# Patient Record
Sex: Female | Born: 1945
Health system: Southern US, Community
[De-identification: ages and names within clinical notes are randomized; demographics above are authoritative.]

## PROBLEM LIST (undated history)

## (undated) DIAGNOSIS — N6099 Unspecified benign mammary dysplasia of unspecified breast: Secondary | ICD-10-CM

## (undated) DIAGNOSIS — H409 Unspecified glaucoma: Secondary | ICD-10-CM

## (undated) HISTORY — DX: Unspecified benign mammary dysplasia of unspecified breast: N60.99

## (undated) HISTORY — PX: COLONOSCOPY: SHX174

## (undated) HISTORY — DX: Unspecified glaucoma: H40.9

## (undated) HISTORY — PX: WISDOM TOOTH EXTRACTION: SHX21

---

## 1999-09-22 ENCOUNTER — Other Ambulatory Visit: Admission: RE | Admit: 1999-09-22 | Discharge: 1999-09-22 | Payer: Self-pay | Admitting: Internal Medicine

## 2002-01-02 ENCOUNTER — Other Ambulatory Visit: Admission: RE | Admit: 2002-01-02 | Discharge: 2002-01-02 | Payer: Self-pay | Admitting: Internal Medicine

## 2004-04-26 ENCOUNTER — Ambulatory Visit: Payer: Self-pay | Admitting: Internal Medicine

## 2004-05-18 ENCOUNTER — Ambulatory Visit: Payer: Self-pay | Admitting: Internal Medicine

## 2004-05-18 ENCOUNTER — Encounter: Admission: RE | Admit: 2004-05-18 | Discharge: 2004-05-18 | Payer: Self-pay | Admitting: Internal Medicine

## 2004-05-28 ENCOUNTER — Ambulatory Visit: Payer: Self-pay | Admitting: Internal Medicine

## 2004-06-01 ENCOUNTER — Ambulatory Visit (HOSPITAL_COMMUNITY): Admission: RE | Admit: 2004-06-01 | Discharge: 2004-06-01 | Payer: Self-pay | Admitting: Internal Medicine

## 2006-08-10 ENCOUNTER — Ambulatory Visit: Payer: Self-pay | Admitting: Internal Medicine

## 2006-08-10 LAB — CONVERTED CEMR LAB
ALT: 22 units/L (ref 0–40)
AST: 21 units/L (ref 0–37)
Albumin: 4.1 g/dL (ref 3.5–5.2)
Alkaline Phosphatase: 75 units/L (ref 39–117)
BUN: 12 mg/dL (ref 6–23)
Basophils Absolute: 0 10*3/uL (ref 0.0–0.1)
Basophils Relative: 0.7 % (ref 0.0–1.0)
Bilirubin Urine: NEGATIVE
Bilirubin, Direct: 0.1 mg/dL (ref 0.0–0.3)
CO2: 32 meq/L (ref 19–32)
Calcium: 9.7 mg/dL (ref 8.4–10.5)
Chloride: 102 meq/L (ref 96–112)
Cholesterol: 186 mg/dL (ref 0–200)
Creatinine, Ser: 1 mg/dL (ref 0.4–1.2)
Eosinophils Absolute: 0.1 10*3/uL (ref 0.0–0.6)
Eosinophils Relative: 2.4 % (ref 0.0–5.0)
GFR calc Af Amer: 73 mL/min
GFR calc non Af Amer: 61 mL/min
Glucose, Bld: 93 mg/dL (ref 70–99)
HCT: 41.4 % (ref 36.0–46.0)
HDL: 114 mg/dL (ref >39.0)
Hemoglobin, Urine: NEGATIVE
Hemoglobin: 14.3 g/dL (ref 12.0–15.0)
Ketones, ur: NEGATIVE mg/dL
LDL Cholesterol: 54 mg/dL (ref 0–99)
Leukocytes, UA: NEGATIVE
Lymphocytes Relative: 30.2 % (ref 12.0–46.0)
MCHC: 34.7 g/dL (ref 30.0–36.0)
MCV: 87.1 fL (ref 78.0–100.0)
Monocytes Absolute: 0.6 10*3/uL (ref 0.2–0.7)
Monocytes Relative: 10.7 % (ref 3.0–11.0)
Neutro Abs: 3.5 10*3/uL (ref 1.4–7.7)
Neutrophils Relative %: 56 % (ref 43.0–77.0)
Nitrite: NEGATIVE
Platelets: 269 10*3/uL (ref 150–400)
Potassium: 3.8 meq/L (ref 3.5–5.1)
RBC: 4.75 M/uL (ref 3.87–5.11)
RDW: 13 % (ref 11.5–14.6)
Sodium: 141 meq/L (ref 135–145)
Specific Gravity, Urine: 1.01 (ref 1.000–1.03)
TSH: 2.37 microintl units/mL (ref 0.35–5.50)
Total Bilirubin: 0.8 mg/dL (ref 0.3–1.2)
Total CHOL/HDL Ratio: 1.6
Total Protein, Urine: NEGATIVE mg/dL
Total Protein: 7.3 g/dL (ref 6.0–8.3)
Triglycerides: 91 mg/dL (ref 0–149)
Urine Glucose: NEGATIVE mg/dL
Urobilinogen, UA: 0.2 (ref 0.0–1.0)
VLDL: 18 mg/dL (ref 0–40)
WBC: 6 10*3/uL (ref 4.5–10.5)
pH: 6 (ref 5.0–8.0)

## 2007-08-06 ENCOUNTER — Telehealth (INDEPENDENT_AMBULATORY_CARE_PROVIDER_SITE_OTHER): Payer: Self-pay | Admitting: *Deleted

## 2007-08-10 ENCOUNTER — Ambulatory Visit: Payer: Self-pay | Admitting: Gastroenterology

## 2007-09-07 ENCOUNTER — Encounter: Payer: Self-pay | Admitting: Gastroenterology

## 2007-09-07 ENCOUNTER — Ambulatory Visit: Payer: Self-pay | Admitting: Gastroenterology

## 2007-09-07 ENCOUNTER — Encounter: Payer: Self-pay | Admitting: Internal Medicine

## 2007-09-15 ENCOUNTER — Encounter: Payer: Self-pay | Admitting: Internal Medicine

## 2007-09-17 ENCOUNTER — Encounter: Payer: Self-pay | Admitting: Gastroenterology

## 2012-08-30 ENCOUNTER — Encounter: Payer: Self-pay | Admitting: Gastroenterology

## 2013-09-24 DIAGNOSIS — Z961 Presence of intraocular lens: Secondary | ICD-10-CM | POA: Diagnosis not present

## 2013-09-24 DIAGNOSIS — H524 Presbyopia: Secondary | ICD-10-CM | POA: Diagnosis not present

## 2013-09-24 DIAGNOSIS — H52209 Unspecified astigmatism, unspecified eye: Secondary | ICD-10-CM | POA: Diagnosis not present

## 2013-12-28 DIAGNOSIS — Z803 Family history of malignant neoplasm of breast: Secondary | ICD-10-CM | POA: Diagnosis not present

## 2013-12-28 DIAGNOSIS — Z1231 Encounter for screening mammogram for malignant neoplasm of breast: Secondary | ICD-10-CM | POA: Diagnosis not present

## 2014-01-09 ENCOUNTER — Encounter: Payer: Self-pay | Admitting: Gastroenterology

## 2014-04-10 DIAGNOSIS — Z23 Encounter for immunization: Secondary | ICD-10-CM | POA: Diagnosis not present

## 2014-07-15 ENCOUNTER — Encounter: Payer: Self-pay | Admitting: Gastroenterology

## 2014-10-31 DIAGNOSIS — H52203 Unspecified astigmatism, bilateral: Secondary | ICD-10-CM | POA: Diagnosis not present

## 2014-10-31 DIAGNOSIS — Z961 Presence of intraocular lens: Secondary | ICD-10-CM | POA: Diagnosis not present

## 2014-10-31 DIAGNOSIS — H40013 Open angle with borderline findings, low risk, bilateral: Secondary | ICD-10-CM | POA: Diagnosis not present

## 2015-01-01 DIAGNOSIS — L209 Atopic dermatitis, unspecified: Secondary | ICD-10-CM | POA: Diagnosis not present

## 2015-01-02 DIAGNOSIS — Z1231 Encounter for screening mammogram for malignant neoplasm of breast: Secondary | ICD-10-CM | POA: Diagnosis not present

## 2015-01-02 DIAGNOSIS — Z803 Family history of malignant neoplasm of breast: Secondary | ICD-10-CM | POA: Diagnosis not present

## 2015-01-09 DIAGNOSIS — R928 Other abnormal and inconclusive findings on diagnostic imaging of breast: Secondary | ICD-10-CM | POA: Diagnosis not present

## 2015-01-09 DIAGNOSIS — R922 Inconclusive mammogram: Secondary | ICD-10-CM | POA: Diagnosis not present

## 2015-01-13 ENCOUNTER — Other Ambulatory Visit: Payer: Self-pay | Admitting: Radiology

## 2015-01-13 DIAGNOSIS — Z0000 Encounter for general adult medical examination without abnormal findings: Secondary | ICD-10-CM | POA: Diagnosis not present

## 2015-01-13 DIAGNOSIS — R921 Mammographic calcification found on diagnostic imaging of breast: Secondary | ICD-10-CM | POA: Diagnosis not present

## 2015-01-13 DIAGNOSIS — N6012 Diffuse cystic mastopathy of left breast: Secondary | ICD-10-CM | POA: Diagnosis not present

## 2015-02-06 DIAGNOSIS — N6092 Unspecified benign mammary dysplasia of left breast: Secondary | ICD-10-CM | POA: Diagnosis not present

## 2015-03-05 ENCOUNTER — Encounter: Payer: Self-pay | Admitting: Internal Medicine

## 2015-03-16 ENCOUNTER — Encounter (HOSPITAL_BASED_OUTPATIENT_CLINIC_OR_DEPARTMENT_OTHER): Payer: Self-pay | Admitting: *Deleted

## 2015-03-17 ENCOUNTER — Other Ambulatory Visit: Payer: Self-pay | Admitting: General Surgery

## 2015-03-17 DIAGNOSIS — N6099 Unspecified benign mammary dysplasia of unspecified breast: Secondary | ICD-10-CM | POA: Insufficient documentation

## 2015-03-17 HISTORY — DX: Unspecified benign mammary dysplasia of unspecified breast: N60.99

## 2015-03-19 DIAGNOSIS — Z0000 Encounter for general adult medical examination without abnormal findings: Secondary | ICD-10-CM | POA: Diagnosis not present

## 2015-03-19 DIAGNOSIS — R921 Mammographic calcification found on diagnostic imaging of breast: Secondary | ICD-10-CM | POA: Diagnosis not present

## 2015-03-23 ENCOUNTER — Ambulatory Visit (HOSPITAL_BASED_OUTPATIENT_CLINIC_OR_DEPARTMENT_OTHER)
Admission: RE | Admit: 2015-03-23 | Discharge: 2015-03-23 | Disposition: A | Discharge status: Home / Self Care | Payer: Medicare Other | Source: Ambulatory Visit | Attending: General Surgery | Admitting: General Surgery

## 2015-03-23 ENCOUNTER — Encounter (HOSPITAL_BASED_OUTPATIENT_CLINIC_OR_DEPARTMENT_OTHER)
Admission: RE | Disposition: A | Discharge status: Home / Self Care | Payer: Self-pay | Source: Ambulatory Visit | Attending: General Surgery

## 2015-03-23 ENCOUNTER — Ambulatory Visit (HOSPITAL_BASED_OUTPATIENT_CLINIC_OR_DEPARTMENT_OTHER): Payer: Medicare Other | Admitting: Anesthesiology

## 2015-03-23 ENCOUNTER — Encounter: Payer: Self-pay | Admitting: Internal Medicine

## 2015-03-23 ENCOUNTER — Encounter (HOSPITAL_BASED_OUTPATIENT_CLINIC_OR_DEPARTMENT_OTHER): Payer: Self-pay | Admitting: *Deleted

## 2015-03-23 DIAGNOSIS — Z86010 Personal history of colonic polyps: Secondary | ICD-10-CM | POA: Diagnosis not present

## 2015-03-23 DIAGNOSIS — Z0000 Encounter for general adult medical examination without abnormal findings: Secondary | ICD-10-CM | POA: Diagnosis not present

## 2015-03-23 DIAGNOSIS — N63 Unspecified lump in breast: Secondary | ICD-10-CM | POA: Diagnosis present

## 2015-03-23 DIAGNOSIS — D242 Benign neoplasm of left breast: Secondary | ICD-10-CM | POA: Insufficient documentation

## 2015-03-23 DIAGNOSIS — N6012 Diffuse cystic mastopathy of left breast: Secondary | ICD-10-CM | POA: Insufficient documentation

## 2015-03-23 DIAGNOSIS — N6092 Unspecified benign mammary dysplasia of left breast: Secondary | ICD-10-CM | POA: Diagnosis not present

## 2015-03-23 DIAGNOSIS — R921 Mammographic calcification found on diagnostic imaging of breast: Secondary | ICD-10-CM | POA: Diagnosis not present

## 2015-03-23 HISTORY — PX: RADIOACTIVE SEED GUIDED EXCISIONAL BREAST BIOPSY: SHX6490

## 2015-03-23 SURGERY — RADIOACTIVE SEED GUIDED BREAST BIOPSY
Anesthesia: General | Site: Breast | Laterality: Left

## 2015-03-23 MED ORDER — PROPOFOL 500 MG/50ML IV EMUL
INTRAVENOUS | Status: AC
  Filled 2015-03-23: qty 50

## 2015-03-23 MED ORDER — ONDANSETRON HCL 4 MG/2ML IJ SOLN
INTRAMUSCULAR | Status: AC
  Filled 2015-03-23: qty 2

## 2015-03-23 MED ORDER — CEFAZOLIN SODIUM-DEXTROSE 2-3 GM-% IV SOLR
2.0000 g | INTRAVENOUS | Status: AC
  Administered 2015-03-23: 2 g via INTRAVENOUS

## 2015-03-23 MED ORDER — MIDAZOLAM HCL 2 MG/2ML IJ SOLN
INTRAMUSCULAR | Status: AC
  Filled 2015-03-23: qty 4

## 2015-03-23 MED ORDER — LIDOCAINE HCL (CARDIAC) 20 MG/ML IV SOLN
INTRAVENOUS | Status: AC
  Filled 2015-03-23: qty 5

## 2015-03-23 MED ORDER — GLYCOPYRROLATE 0.2 MG/ML IJ SOLN: 0.2000 mg | Freq: Once | INTRAMUSCULAR | Status: DC | PRN

## 2015-03-23 MED ORDER — MEPERIDINE HCL 25 MG/ML IJ SOLN: 6.2500 mg | INTRAMUSCULAR | Status: DC | PRN

## 2015-03-23 MED ORDER — MIDAZOLAM HCL 2 MG/2ML IJ SOLN
1.0000 mg | INTRAMUSCULAR | Status: DC | PRN
  Administered 2015-03-23: 2 mg via INTRAVENOUS

## 2015-03-23 MED ORDER — BUPIVACAINE HCL (PF) 0.25 % IJ SOLN
INTRAMUSCULAR | Status: DC | PRN
  Administered 2015-03-23: 10 mL

## 2015-03-23 MED ORDER — DEXAMETHASONE SODIUM PHOSPHATE 10 MG/ML IJ SOLN
INTRAMUSCULAR | Status: AC
  Filled 2015-03-23: qty 1

## 2015-03-23 MED ORDER — CEFAZOLIN SODIUM-DEXTROSE 2-3 GM-% IV SOLR
INTRAVENOUS | Status: AC
  Filled 2015-03-23: qty 50

## 2015-03-23 MED ORDER — ONDANSETRON HCL 4 MG/2ML IJ SOLN
INTRAMUSCULAR | Status: DC | PRN
  Administered 2015-03-23: 4 mg via INTRAVENOUS

## 2015-03-23 MED ORDER — EPHEDRINE SULFATE 50 MG/ML IJ SOLN
INTRAMUSCULAR | Status: AC
  Filled 2015-03-23: qty 1

## 2015-03-23 MED ORDER — OXYCODONE HCL 5 MG PO TABS: 5.0000 mg | ORAL_TABLET | Freq: Once | ORAL | Status: DC | PRN

## 2015-03-23 MED ORDER — FENTANYL CITRATE (PF) 100 MCG/2ML IJ SOLN
50.0000 ug | INTRAMUSCULAR | Status: DC | PRN
  Administered 2015-03-23: 100 ug via INTRAVENOUS

## 2015-03-23 MED ORDER — DEXAMETHASONE SODIUM PHOSPHATE 4 MG/ML IJ SOLN
INTRAMUSCULAR | Status: DC | PRN
  Administered 2015-03-23: 10 mg via INTRAVENOUS

## 2015-03-23 MED ORDER — HYDROCODONE-ACETAMINOPHEN 5-325 MG PO TABS: 1.0000 | ORAL_TABLET | Freq: Four times a day (QID) | ORAL | Status: DC | PRN

## 2015-03-23 MED ORDER — HYDROMORPHONE HCL 1 MG/ML IJ SOLN: 0.2500 mg | INTRAMUSCULAR | Status: DC | PRN

## 2015-03-23 MED ORDER — LACTATED RINGERS IV SOLN
INTRAVENOUS | Status: DC
  Administered 2015-03-23 (×3): via INTRAVENOUS

## 2015-03-23 MED ORDER — SCOPOLAMINE 1 MG/3DAYS TD PT72
1.0000 | MEDICATED_PATCH | Freq: Once | TRANSDERMAL | Status: DC | PRN
  Administered 2015-03-23: 1.5 mg via TRANSDERMAL

## 2015-03-23 MED ORDER — SCOPOLAMINE 1 MG/3DAYS TD PT72
MEDICATED_PATCH | TRANSDERMAL | Status: AC
  Filled 2015-03-23: qty 1

## 2015-03-23 MED ORDER — PROPOFOL 10 MG/ML IV BOLUS
INTRAVENOUS | Status: DC | PRN
  Administered 2015-03-23: 200 mg via INTRAVENOUS

## 2015-03-23 MED ORDER — BUPIVACAINE HCL (PF) 0.25 % IJ SOLN
INTRAMUSCULAR | Status: AC
  Filled 2015-03-23: qty 30

## 2015-03-23 MED ORDER — ATROPINE SULFATE 0.4 MG/ML IJ SOLN
INTRAMUSCULAR | Status: AC
  Filled 2015-03-23: qty 1

## 2015-03-23 MED ORDER — OXYCODONE HCL 5 MG/5ML PO SOLN: 5.0000 mg | Freq: Once | ORAL | Status: DC | PRN

## 2015-03-23 MED ORDER — SUCCINYLCHOLINE CHLORIDE 20 MG/ML IJ SOLN
INTRAMUSCULAR | Status: AC
  Filled 2015-03-23: qty 1

## 2015-03-23 MED ORDER — FENTANYL CITRATE (PF) 100 MCG/2ML IJ SOLN
INTRAMUSCULAR | Status: AC
  Filled 2015-03-23: qty 4

## 2015-03-23 MED ORDER — LIDOCAINE HCL (CARDIAC) 20 MG/ML IV SOLN
INTRAVENOUS | Status: DC | PRN
  Administered 2015-03-23: 75 mg via INTRAVENOUS

## 2015-03-23 SURGICAL SUPPLY — 60 items
APPLIER CLIP 9.375 MED OPEN (MISCELLANEOUS)
BINDER BREAST LRG (GAUZE/BANDAGES/DRESSINGS) ×3 IMPLANT
BINDER BREAST MEDIUM (GAUZE/BANDAGES/DRESSINGS) IMPLANT
BINDER BREAST XLRG (GAUZE/BANDAGES/DRESSINGS) IMPLANT
BINDER BREAST XXLRG (GAUZE/BANDAGES/DRESSINGS) IMPLANT
BLADE SURG 15 STRL LF DISP TIS (BLADE) ×1 IMPLANT
BLADE SURG 15 STRL SS (BLADE) ×2
CANISTER SUC SOCK COL 7IN (MISCELLANEOUS) IMPLANT
CANISTER SUCT 1200ML W/VALVE (MISCELLANEOUS) IMPLANT
CHLORAPREP W/TINT 26ML (MISCELLANEOUS) ×3 IMPLANT
CLIP APPLIE 9.375 MED OPEN (MISCELLANEOUS) IMPLANT
CLOSURE WOUND 1/2 X4 (GAUZE/BANDAGES/DRESSINGS) ×1
COVER BACK TABLE 60X90IN (DRAPES) ×3 IMPLANT
COVER MAYO STAND STRL (DRAPES) ×3 IMPLANT
COVER PROBE W GEL 5X96 (DRAPES) ×3 IMPLANT
DECANTER SPIKE VIAL GLASS SM (MISCELLANEOUS) IMPLANT
DEVICE DUBIN W/COMP PLATE 8390 (MISCELLANEOUS) ×3 IMPLANT
DRAPE LAPAROSCOPIC ABDOMINAL (DRAPES) ×3 IMPLANT
DRAPE UTILITY XL STRL (DRAPES) ×3 IMPLANT
DRSG TEGADERM 4X4.75 (GAUZE/BANDAGES/DRESSINGS) IMPLANT
ELECT COATED BLADE 2.86 ST (ELECTRODE) ×3 IMPLANT
ELECT REM PT RETURN 9FT ADLT (ELECTROSURGICAL) ×3
ELECTRODE REM PT RTRN 9FT ADLT (ELECTROSURGICAL) ×1 IMPLANT
GLOVE BIO SURGEON STRL SZ 6.5 (GLOVE) ×2 IMPLANT
GLOVE BIO SURGEON STRL SZ7 (GLOVE) ×6 IMPLANT
GLOVE BIO SURGEONS STRL SZ 6.5 (GLOVE) ×1
GLOVE BIOGEL M 7.0 STRL (GLOVE) ×3 IMPLANT
GLOVE BIOGEL PI IND STRL 7.5 (GLOVE) ×1 IMPLANT
GLOVE BIOGEL PI INDICATOR 7.5 (GLOVE) ×2
GOWN STRL REUS W/ TWL LRG LVL3 (GOWN DISPOSABLE) ×2 IMPLANT
GOWN STRL REUS W/TWL LRG LVL3 (GOWN DISPOSABLE) ×4
ILLUMINATOR WAVEGUIDE N/F (MISCELLANEOUS) ×3 IMPLANT
KIT MARKER MARGIN INK (KITS) ×3 IMPLANT
LIGHT WAVEGUIDE WIDE FLAT (MISCELLANEOUS) IMPLANT
LIQUID BAND (GAUZE/BANDAGES/DRESSINGS) ×3 IMPLANT
MARKER SKIN DUAL TIP RULER LAB (MISCELLANEOUS) ×3 IMPLANT
NEEDLE HYPO 25X1 1.5 SAFETY (NEEDLE) ×3 IMPLANT
NS IRRIG 1000ML POUR BTL (IV SOLUTION) IMPLANT
PACK BASIN DAY SURGERY FS (CUSTOM PROCEDURE TRAY) ×3 IMPLANT
PENCIL BUTTON HOLSTER BLD 10FT (ELECTRODE) ×3 IMPLANT
SHEET MEDIUM DRAPE 40X70 STRL (DRAPES) IMPLANT
SLEEVE SCD COMPRESS KNEE MED (MISCELLANEOUS) ×3 IMPLANT
SPONGE GAUZE 4X4 12PLY STER LF (GAUZE/BANDAGES/DRESSINGS) IMPLANT
SPONGE LAP 4X18 X RAY DECT (DISPOSABLE) ×3 IMPLANT
STAPLER VISISTAT 35W (STAPLE) IMPLANT
STRIP CLOSURE SKIN 1/2X4 (GAUZE/BANDAGES/DRESSINGS) ×2 IMPLANT
SUT MNCRL AB 4-0 PS2 18 (SUTURE) IMPLANT
SUT MON AB 5-0 PS2 18 (SUTURE) IMPLANT
SUT SILK 2 0 SH (SUTURE) IMPLANT
SUT VIC AB 2-0 SH 27 (SUTURE) ×2
SUT VIC AB 2-0 SH 27XBRD (SUTURE) ×1 IMPLANT
SUT VIC AB 3-0 SH 27 (SUTURE) ×2
SUT VIC AB 3-0 SH 27X BRD (SUTURE) ×1 IMPLANT
SUT VIC AB 5-0 PS2 18 (SUTURE) IMPLANT
SYR CONTROL 10ML LL (SYRINGE) ×3 IMPLANT
TOWEL OR 17X24 6PK STRL BLUE (TOWEL DISPOSABLE) ×3 IMPLANT
TOWEL OR NON WOVEN STRL DISP B (DISPOSABLE) ×3 IMPLANT
TUBE CONNECTING 20'X1/4 (TUBING)
TUBE CONNECTING 20X1/4 (TUBING) IMPLANT
YANKAUER SUCT BULB TIP NO VENT (SUCTIONS) IMPLANT

## 2015-03-23 NOTE — Transfer of Care (Signed)
Immediate Anesthesia Transfer of Care Note  Patient: Monique Atkins  Procedure(s) Performed: Procedure(s): RADIOACTIVE SEED GUIDED EXCISIONAL LEFT BREAST BIOPSY (Left)  Patient Location: PACU  Anesthesia Type:General  Level of Consciousness: sedated  Airway & Oxygen Therapy: Patient Spontanous Breathing and Patient connected to face mask oxygen  Post-op Assessment: Report given to RN and Post -op Vital signs reviewed and stable  Post vital signs: Reviewed and stable  Last Vitals:  Filed Vitals:   03/23/15 0625  BP: 132/73  Pulse: 63  Temp: 36.6 C  Resp: 18    Complications: No apparent anesthesia complications

## 2015-03-23 NOTE — H&P (Signed)
   69 yof presents after screening mm shows a 1 cm area of calcs in left breast, density B. this underwent stereo biopsy that shows adh. she has no mass or discharge on exam. no prior history of breast disease or abnl mm. she has fh in maternal aunt with breast cancer. she is here to discuss biopsy   Other Problems  Back Pain Hemorrhoids  Past Surgical History  Breast Biopsy Left. Colon Polyp Removal - Colonoscopy  Diagnostic Studies History  Colonoscopy 5-10 years ago Mammogram within last year Pap Smear >5 years ago  Allergies  No Known Drug Allergies09/16/2016  Medication History  No Current Medications Medications Reconciled  Social History  Alcohol use Moderate alcohol use. Caffeine use Coffee, Tea. No drug use Tobacco use Never smoker.  Family History  Alcohol Abuse Son. Arthritis Mother, Sister. Breast Cancer Family Members In General. Cerebrovascular Accident Mother. Hypertension Son. Respiratory Condition Son.  Pregnancy / Birth History  Age at menarche 62 years. Age of menopause 95-50 Contraceptive History Oral contraceptives. Gravida 1 Irregular periods Maternal age 86-25 Para 1  Review of Systems  General Not Present- Appetite Loss, Chills, Fatigue, Fever, Night Sweats, Weight Gain and Weight Loss. Skin Present- Rash. Not Present- Change in Wart/Mole, Dryness, Hives, Jaundice, New Lesions, Non-Healing Wounds and Ulcer. HEENT Present- Wears glasses/contact lenses. Not Present- Earache, Hearing Loss, Hoarseness, Nose Bleed, Oral Ulcers, Ringing in the Ears, Seasonal Allergies, Sinus Pain, Sore Throat, Visual Disturbances and Yellow Eyes. Respiratory Not Present- Bloody sputum, Chronic Cough, Difficulty Breathing, Snoring and Wheezing. Breast Not Present- Breast Mass, Breast Pain, Nipple Discharge and Skin Changes. Cardiovascular Not Present- Chest Pain, Difficulty Breathing Lying Down, Leg Cramps, Palpitations, Rapid Heart  Rate, Shortness of Breath and Swelling of Extremities. Gastrointestinal Not Present- Abdominal Pain, Bloating, Bloody Stool, Change in Bowel Habits, Chronic diarrhea, Constipation, Difficulty Swallowing, Excessive gas, Gets full quickly at meals, Hemorrhoids, Indigestion, Nausea, Rectal Pain and Vomiting. Female Genitourinary Not Present- Frequency, Nocturia, Painful Urination, Pelvic Pain and Urgency. Musculoskeletal Not Present- Back Pain, Joint Pain, Joint Stiffness, Muscle Pain, Muscle Weakness and Swelling of Extremities. Neurological Not Present- Decreased Memory, Fainting, Headaches, Numbness, Seizures, Tingling, Tremor, Trouble walking and Weakness. Psychiatric Not Present- Anxiety, Bipolar, Change in Sleep Pattern, Depression, Fearful and Frequent crying. Endocrine Not Present- Cold Intolerance, Excessive Hunger, Hair Changes, Heat Intolerance, Hot flashes and New Diabetes. Hematology Not Present- Easy Bruising, Excessive bleeding, Gland problems, HIV and Persistent Infections.  Vitals  Weight: 148 lb Height: 66in Body Surface Area: 1.77 m Body Mass Index: 23.89 kg/m  Temp.: 57F(Temporal)  Pulse: 72 (Regular)  BP: 118/68 (Sitting, Left Arm, Standard)  Physical Exam  General Mental Status-Alert. Orientation-Oriented X3. Chest and Lung Exam Chest and lung exam reveals -on auscultation, normal breath sounds, no adventitious sounds and normal vocal resonance. Breast Nipples Discharge - Bilateral - None. Breast Lump-No Palpable Breast Mass. Cardiovascular Cardiovascular examination reveals -normal heart sounds, regular rate and rhythm with no murmurs. Lymphatic Head & Neck General Head & Neck Lymphatics: Bilateral - Description - Normal. Axillary General Axillary Region: Bilateral - Description - Normal. Note: no [place] adenopathy   Assessment & Plan  ATYPICAL DUCTAL HYPERPLASIA OF LEFT BREAST (N60.92) Story: Left breast seed guided excisional  biopsy discussed indication being upgrade of path in 10-20%. recommended seed guided excisional biopsy. we discussed risks/postop recovery and will schedule.

## 2015-03-23 NOTE — Discharge Instructions (Signed)
[facility] °Office Phone Number [phone] ° °POST OP INSTRUCTIONS ° °Always review your discharge instruction sheet given to you by the facility where your surgery was performed. ° °IF YOU HAVE DISABILITY OR FAMILY LEAVE FORMS, YOU MUST BRING THEM TO THE OFFICE FOR PROCESSING.  DO NOT GIVE THEM TO YOUR DOCTOR. ° °1. A prescription for pain medication may be given to you upon discharge.  Take your pain medication as prescribed, if needed.  If narcotic pain medicine is not needed, then you may take acetaminophen (Tylenol), naprosyn (Alleve) or ibuprofen (Advil) as needed. °2. Take your usually prescribed medications unless otherwise directed °3. If you need a refill on your pain medication, please contact your pharmacy.  They will contact our office to request authorization.  Prescriptions will not be filled after 5pm or on week-ends. °4. You should eat very light the first 24 hours after surgery, such as soup, crackers, pudding, etc.  Resume your normal diet the day after surgery. °5. Most patients will experience some swelling and bruising in the breast.  Ice packs and a good support bra will help.  Wear the breast binder provided or a sports bra for 72 hours day and night.  After that wear a sports bra during the day until you return to the office. Swelling and bruising can take several days to resolve.  °6. It is common to experience some constipation if taking pain medication after surgery.  Increasing fluid intake and taking a stool softener will usually help or prevent this problem from occurring.  A mild laxative (Milk of Magnesia or Miralax) should be taken according to package directions if there are no bowel movements after 48 hours. °7. Unless discharge instructions indicate otherwise, you may remove your bandages 48 hours after surgery and you may shower at that time.  You may have steri-strips (small skin tapes) in place directly over the incision.  These strips should be left on the  skin for 7-10 days and will come off on their own.  If your surgeon used skin glue on the incision, you may shower in 24 hours.  The glue will flake off over the next 2-3 weeks.  Any sutures or staples will be removed at the office during your follow-up visit. °8. ACTIVITIES:  You may resume regular daily activities (gradually increasing) beginning the next day.  Wearing a good support bra or sports bra minimizes pain and swelling.  You may have sexual intercourse when it is comfortable. °a. You may drive when you no longer are taking prescription pain medication, you can comfortably wear a seatbelt, and you can safely maneuver your car and apply brakes. °b. RETURN TO WORK:  ______________________________________________________________________________________ °9. You should see your doctor in the office for a follow-up appointment approximately two weeks after your surgery.  Your doctor’s nurse will typically make your follow-up appointment when she calls you with your pathology report.  Expect your pathology report 3-4 business days after your surgery.  You may call to check if you do not hear from us after three days. °10. OTHER INSTRUCTIONS: _______________________________________________________________________________________________ _____________________________________________________________________________________________________________________________________ °_____________________________________________________________________________________________________________________________________ °_____________________________________________________________________________________________________________________________________ ° °WHEN TO CALL DR WAKEFIELD: °1. Fever over 101.0 °2. Nausea and/or vomiting. °3. Extreme swelling or bruising. °4. Continued bleeding from incision. °5. Increased pain, redness, or drainage from the incision. ° °The clinic staff is available to answer your questions during regular  business hours.  Please don’t hesitate to call and ask to speak to one of the nurses for clinical concerns.  If   you have a medical emergency, go to the nearest emergency room or call 911.  A surgeon from [facility] is always on call at the hospital. ° °For further questions, please visit [email or web address] ° ° ° °Post Anesthesia Home Care Instructions ° °Activity: °Get plenty of rest for the remainder of the day. A responsible adult should stay with you for 24 hours following the procedure.  °For the next 24 hours, DO NOT: °-Drive a car °-Operate machinery °-Drink alcoholic beverages °-Take any medication unless instructed by your physician °-Make any legal decisions or sign important papers. ° °Meals: °Start with liquid foods such as gelatin or soup. Progress to regular foods as tolerated. Avoid greasy, spicy, heavy foods. If nausea and/or vomiting occur, drink only clear liquids until the nausea and/or vomiting subsides. Call your physician if vomiting continues. ° °Special Instructions/Symptoms: °Your throat may feel dry or sore from the anesthesia or the breathing tube placed in your throat during surgery. If this causes discomfort, gargle with warm salt water. The discomfort should disappear within 24 hours. ° °If you had a scopolamine patch placed behind your ear for the management of post- operative nausea and/or vomiting: ° °1. The medication in the patch is effective for 72 hours, after which it should be removed.  Wrap patch in a tissue and discard in the trash. Wash hands thoroughly with soap and water. °2. You may remove the patch earlier than 72 hours if you experience unpleasant side effects which may include dry mouth, dizziness or visual disturbances. °3. Avoid touching the patch. Wash your hands with soap and water after contact with the patch. °  ° °

## 2015-03-23 NOTE — Anesthesia Postprocedure Evaluation (Signed)
  Anesthesia Post-op Note  Patient: Monique Atkins  Procedure(s) Performed: Procedure(s): RADIOACTIVE SEED GUIDED EXCISIONAL LEFT BREAST BIOPSY (Left)  Patient Location: PACU  Anesthesia Type: General   Level of Consciousness: awake, alert  and oriented  Airway and Oxygen Therapy: Patient Spontanous Breathing  Post-op Pain: none  Post-op Assessment: Post-op Vital signs reviewed  Post-op Vital Signs: Reviewed  Last Vitals:  Filed Vitals:   03/23/15 0845  BP: 113/66  Pulse: 68  Temp:   Resp: 14    Complications: No apparent anesthesia complications

## 2015-03-23 NOTE — Op Note (Addendum)
Preoperative diagnosis: Left breast mass, core with adh Postoperative diagnosis: same as above Procedure: Left breast radioactive seed guided excisional biopsy Surgeon: Dr Serita Grammes Anesthesia: general EBL: minimal Drains none Complications none Specimen left breast tissue marked with paint Sponge and needle count was correct to completion Suspicion to recovery stable  Indications: This is 81 yof with a left breast mass on mm. This underwent core biopsy and is ADH.  We discussed rsl excision of the area.  The seed was placed prior to beginning. I had the mamograms in the OR  Procedure: After informed consent was obtained the patient was taken to the operating room. She was given antibiotics. Sequential compression devices were on her legs. She was then placed under general anesthesia without complication. She was prepped and draped in the standard sterile surgical fashion. A surgical timeout was then performed.  I infiltrated quarter percent Marcaine around the cavity and then made a periareolar incision. I used the lighted retractor system to tunnel to the lesion.  I then used the neoprobe to identify the radioactive seed and then removed this.Hemostasis was observed. I painted the specimen. A mammogram was taken confirming removal of the clip and the seed.There was no radioactivity remaining in the breast. I then closed this with 2-0 Vicryl, 3-0 Vicryl, and 5-0 Monocryl. Glue was placed over the wound. A breast binder was placed. She tolerated this well was extubated and transferred to recovery in stable condition

## 2015-03-23 NOTE — Anesthesia Preprocedure Evaluation (Signed)

## 2015-03-23 NOTE — Anesthesia Procedure Notes (Signed)
Procedure Name: LMA Insertion Date/Time: 03/23/2015 7:36 AM Performed by: Melynda Ripple D Pre-anesthesia Checklist: Patient identified, Emergency Drugs available, Suction available and Patient being monitored Patient Re-evaluated:Patient Re-evaluated prior to inductionOxygen Delivery Method: Circle System Utilized Preoxygenation: Pre-oxygenation with 100% oxygen Intubation Type: IV induction Ventilation: Mask ventilation without difficulty LMA: LMA inserted LMA Size: 4.0 Number of attempts: 1 Airway Equipment and Method: Bite block Placement Confirmation: positive ETCO2 Tube secured with: Tape Dental Injury: Teeth and Oropharynx as per pre-operative assessment

## 2015-05-08 DIAGNOSIS — R21 Rash and other nonspecific skin eruption: Secondary | ICD-10-CM | POA: Diagnosis not present

## 2015-05-08 DIAGNOSIS — J060 Acute laryngopharyngitis: Secondary | ICD-10-CM | POA: Diagnosis not present

## 2015-06-09 DIAGNOSIS — H5340 Unspecified visual field defects: Secondary | ICD-10-CM | POA: Diagnosis not present

## 2015-06-09 DIAGNOSIS — H401112 Primary open-angle glaucoma, right eye, moderate stage: Secondary | ICD-10-CM | POA: Diagnosis not present

## 2015-06-19 DIAGNOSIS — Z1211 Encounter for screening for malignant neoplasm of colon: Secondary | ICD-10-CM | POA: Diagnosis not present

## 2015-06-19 DIAGNOSIS — Z0000 Encounter for general adult medical examination without abnormal findings: Secondary | ICD-10-CM | POA: Diagnosis not present

## 2015-06-19 DIAGNOSIS — Z13820 Encounter for screening for osteoporosis: Secondary | ICD-10-CM | POA: Diagnosis not present

## 2015-06-26 DIAGNOSIS — Z13220 Encounter for screening for lipoid disorders: Secondary | ICD-10-CM | POA: Diagnosis not present

## 2015-06-26 DIAGNOSIS — Z823 Family history of stroke: Secondary | ICD-10-CM | POA: Diagnosis not present

## 2015-06-26 DIAGNOSIS — Z0000 Encounter for general adult medical examination without abnormal findings: Secondary | ICD-10-CM | POA: Diagnosis not present

## 2015-06-29 DIAGNOSIS — M8589 Other specified disorders of bone density and structure, multiple sites: Secondary | ICD-10-CM | POA: Diagnosis not present

## 2015-06-29 DIAGNOSIS — Z780 Asymptomatic menopausal state: Secondary | ICD-10-CM | POA: Diagnosis not present

## 2015-07-08 DIAGNOSIS — H401112 Primary open-angle glaucoma, right eye, moderate stage: Secondary | ICD-10-CM | POA: Diagnosis not present

## 2015-07-13 DIAGNOSIS — Z1211 Encounter for screening for malignant neoplasm of colon: Secondary | ICD-10-CM | POA: Diagnosis not present

## 2015-08-04 DIAGNOSIS — Z1211 Encounter for screening for malignant neoplasm of colon: Secondary | ICD-10-CM | POA: Diagnosis not present

## 2015-08-24 ENCOUNTER — Other Ambulatory Visit: Payer: Self-pay

## 2015-08-24 DIAGNOSIS — Z79899 Other long term (current) drug therapy: Secondary | ICD-10-CM | POA: Diagnosis not present

## 2015-08-24 DIAGNOSIS — K5730 Diverticulosis of large intestine without perforation or abscess without bleeding: Secondary | ICD-10-CM | POA: Diagnosis not present

## 2015-08-24 DIAGNOSIS — D125 Benign neoplasm of sigmoid colon: Secondary | ICD-10-CM | POA: Diagnosis not present

## 2015-08-24 DIAGNOSIS — Z1211 Encounter for screening for malignant neoplasm of colon: Secondary | ICD-10-CM | POA: Diagnosis not present

## 2015-08-24 DIAGNOSIS — K648 Other hemorrhoids: Secondary | ICD-10-CM | POA: Diagnosis not present

## 2016-01-05 DIAGNOSIS — H5340 Unspecified visual field defects: Secondary | ICD-10-CM | POA: Diagnosis not present

## 2016-01-05 DIAGNOSIS — H401112 Primary open-angle glaucoma, right eye, moderate stage: Secondary | ICD-10-CM | POA: Diagnosis not present

## 2016-02-25 DIAGNOSIS — R928 Other abnormal and inconclusive findings on diagnostic imaging of breast: Secondary | ICD-10-CM | POA: Diagnosis not present

## 2016-03-11 DIAGNOSIS — R300 Dysuria: Secondary | ICD-10-CM | POA: Diagnosis not present

## 2016-03-28 DIAGNOSIS — R1030 Lower abdominal pain, unspecified: Secondary | ICD-10-CM | POA: Diagnosis not present

## 2016-04-05 DIAGNOSIS — R601 Generalized edema: Secondary | ICD-10-CM | POA: Diagnosis not present

## 2016-04-05 DIAGNOSIS — R040 Epistaxis: Secondary | ICD-10-CM | POA: Diagnosis not present

## 2016-04-05 DIAGNOSIS — R1084 Generalized abdominal pain: Secondary | ICD-10-CM | POA: Diagnosis not present

## 2016-04-05 DIAGNOSIS — Z23 Encounter for immunization: Secondary | ICD-10-CM | POA: Diagnosis not present

## 2016-04-05 DIAGNOSIS — R600 Localized edema: Secondary | ICD-10-CM | POA: Diagnosis not present

## 2016-04-05 HISTORY — PX: COLONOSCOPY: SHX174

## 2016-04-05 LAB — HM COLONOSCOPY

## 2016-04-19 DIAGNOSIS — K769 Liver disease, unspecified: Secondary | ICD-10-CM | POA: Diagnosis not present

## 2016-04-19 DIAGNOSIS — R1084 Generalized abdominal pain: Secondary | ICD-10-CM | POA: Diagnosis not present

## 2016-04-19 DIAGNOSIS — K8020 Calculus of gallbladder without cholecystitis without obstruction: Secondary | ICD-10-CM | POA: Diagnosis not present

## 2016-04-19 DIAGNOSIS — K439 Ventral hernia without obstruction or gangrene: Secondary | ICD-10-CM | POA: Diagnosis not present

## 2016-04-19 DIAGNOSIS — D259 Leiomyoma of uterus, unspecified: Secondary | ICD-10-CM | POA: Diagnosis not present

## 2016-05-26 DIAGNOSIS — N3001 Acute cystitis with hematuria: Secondary | ICD-10-CM | POA: Diagnosis not present

## 2016-06-14 DIAGNOSIS — J209 Acute bronchitis, unspecified: Secondary | ICD-10-CM | POA: Diagnosis not present

## 2016-06-24 DIAGNOSIS — J208 Acute bronchitis due to other specified organisms: Secondary | ICD-10-CM | POA: Diagnosis not present

## 2016-09-26 DIAGNOSIS — H401112 Primary open-angle glaucoma, right eye, moderate stage: Secondary | ICD-10-CM | POA: Diagnosis not present

## 2016-10-31 DIAGNOSIS — H401112 Primary open-angle glaucoma, right eye, moderate stage: Secondary | ICD-10-CM | POA: Diagnosis not present

## 2017-02-23 DIAGNOSIS — B078 Other viral warts: Secondary | ICD-10-CM | POA: Diagnosis not present

## 2017-02-23 DIAGNOSIS — D485 Neoplasm of uncertain behavior of skin: Secondary | ICD-10-CM | POA: Diagnosis not present

## 2017-02-23 DIAGNOSIS — Z23 Encounter for immunization: Secondary | ICD-10-CM | POA: Diagnosis not present

## 2017-03-06 DIAGNOSIS — M545 Low back pain: Secondary | ICD-10-CM | POA: Diagnosis not present

## 2017-03-17 DIAGNOSIS — M25551 Pain in right hip: Secondary | ICD-10-CM | POA: Diagnosis not present

## 2017-03-17 DIAGNOSIS — Z23 Encounter for immunization: Secondary | ICD-10-CM | POA: Diagnosis not present

## 2017-03-22 DIAGNOSIS — Z1231 Encounter for screening mammogram for malignant neoplasm of breast: Secondary | ICD-10-CM | POA: Diagnosis not present

## 2017-03-27 DIAGNOSIS — Z0000 Encounter for general adult medical examination without abnormal findings: Secondary | ICD-10-CM | POA: Diagnosis not present

## 2017-03-28 DIAGNOSIS — Z23 Encounter for immunization: Secondary | ICD-10-CM | POA: Diagnosis not present

## 2017-03-28 DIAGNOSIS — L821 Other seborrheic keratosis: Secondary | ICD-10-CM | POA: Diagnosis not present

## 2017-03-28 DIAGNOSIS — L570 Actinic keratosis: Secondary | ICD-10-CM | POA: Diagnosis not present

## 2017-03-28 DIAGNOSIS — D1801 Hemangioma of skin and subcutaneous tissue: Secondary | ICD-10-CM | POA: Diagnosis not present

## 2017-03-28 DIAGNOSIS — L814 Other melanin hyperpigmentation: Secondary | ICD-10-CM | POA: Diagnosis not present

## 2017-04-03 DIAGNOSIS — N390 Urinary tract infection, site not specified: Secondary | ICD-10-CM | POA: Diagnosis not present

## 2017-04-03 DIAGNOSIS — N3000 Acute cystitis without hematuria: Secondary | ICD-10-CM | POA: Diagnosis not present

## 2017-05-05 DIAGNOSIS — H401122 Primary open-angle glaucoma, left eye, moderate stage: Secondary | ICD-10-CM | POA: Diagnosis not present

## 2017-05-05 DIAGNOSIS — H401112 Primary open-angle glaucoma, right eye, moderate stage: Secondary | ICD-10-CM | POA: Diagnosis not present

## 2017-06-13 DIAGNOSIS — J208 Acute bronchitis due to other specified organisms: Secondary | ICD-10-CM | POA: Diagnosis not present

## 2017-11-03 DIAGNOSIS — H401112 Primary open-angle glaucoma, right eye, moderate stage: Secondary | ICD-10-CM | POA: Diagnosis not present

## 2017-11-03 DIAGNOSIS — H401122 Primary open-angle glaucoma, left eye, moderate stage: Secondary | ICD-10-CM | POA: Diagnosis not present

## 2018-03-31 DIAGNOSIS — Z1231 Encounter for screening mammogram for malignant neoplasm of breast: Secondary | ICD-10-CM | POA: Diagnosis not present

## 2018-04-24 DIAGNOSIS — Z23 Encounter for immunization: Secondary | ICD-10-CM | POA: Diagnosis not present

## 2018-04-24 DIAGNOSIS — J060 Acute laryngopharyngitis: Secondary | ICD-10-CM | POA: Diagnosis not present

## 2018-05-07 DIAGNOSIS — H524 Presbyopia: Secondary | ICD-10-CM | POA: Diagnosis not present

## 2018-05-07 DIAGNOSIS — H401112 Primary open-angle glaucoma, right eye, moderate stage: Secondary | ICD-10-CM | POA: Diagnosis not present

## 2018-05-07 DIAGNOSIS — H401122 Primary open-angle glaucoma, left eye, moderate stage: Secondary | ICD-10-CM | POA: Diagnosis not present

## 2018-11-06 DIAGNOSIS — H401112 Primary open-angle glaucoma, right eye, moderate stage: Secondary | ICD-10-CM | POA: Diagnosis not present

## 2018-11-14 DIAGNOSIS — Z0001 Encounter for general adult medical examination with abnormal findings: Secondary | ICD-10-CM | POA: Diagnosis not present

## 2018-11-14 DIAGNOSIS — Z13220 Encounter for screening for lipoid disorders: Secondary | ICD-10-CM | POA: Diagnosis not present

## 2018-11-14 DIAGNOSIS — Z23 Encounter for immunization: Secondary | ICD-10-CM | POA: Diagnosis not present

## 2018-11-14 DIAGNOSIS — R29898 Other symptoms and signs involving the musculoskeletal system: Secondary | ICD-10-CM | POA: Diagnosis not present

## 2018-11-14 DIAGNOSIS — M8589 Other specified disorders of bone density and structure, multiple sites: Secondary | ICD-10-CM | POA: Diagnosis not present

## 2018-11-14 DIAGNOSIS — Z131 Encounter for screening for diabetes mellitus: Secondary | ICD-10-CM | POA: Diagnosis not present

## 2018-11-14 DIAGNOSIS — R197 Diarrhea, unspecified: Secondary | ICD-10-CM | POA: Diagnosis not present

## 2018-11-15 DIAGNOSIS — Z131 Encounter for screening for diabetes mellitus: Secondary | ICD-10-CM | POA: Diagnosis not present

## 2018-11-15 DIAGNOSIS — Z13220 Encounter for screening for lipoid disorders: Secondary | ICD-10-CM | POA: Diagnosis not present

## 2018-11-15 DIAGNOSIS — M8589 Other specified disorders of bone density and structure, multiple sites: Secondary | ICD-10-CM | POA: Diagnosis not present

## 2018-11-15 DIAGNOSIS — R197 Diarrhea, unspecified: Secondary | ICD-10-CM | POA: Diagnosis not present

## 2018-11-15 DIAGNOSIS — E782 Mixed hyperlipidemia: Secondary | ICD-10-CM | POA: Diagnosis not present

## 2018-12-17 DIAGNOSIS — R944 Abnormal results of kidney function studies: Secondary | ICD-10-CM | POA: Diagnosis not present

## 2018-12-20 ENCOUNTER — Other Ambulatory Visit: Payer: Self-pay

## 2019-04-02 DIAGNOSIS — L570 Actinic keratosis: Secondary | ICD-10-CM | POA: Diagnosis not present

## 2019-04-02 DIAGNOSIS — L700 Acne vulgaris: Secondary | ICD-10-CM | POA: Diagnosis not present

## 2019-04-02 DIAGNOSIS — D1801 Hemangioma of skin and subcutaneous tissue: Secondary | ICD-10-CM | POA: Diagnosis not present

## 2019-04-02 DIAGNOSIS — Z23 Encounter for immunization: Secondary | ICD-10-CM | POA: Diagnosis not present

## 2019-04-02 DIAGNOSIS — L814 Other melanin hyperpigmentation: Secondary | ICD-10-CM | POA: Diagnosis not present

## 2019-04-02 DIAGNOSIS — L821 Other seborrheic keratosis: Secondary | ICD-10-CM | POA: Diagnosis not present

## 2019-04-02 DIAGNOSIS — L818 Other specified disorders of pigmentation: Secondary | ICD-10-CM | POA: Diagnosis not present

## 2019-04-06 DIAGNOSIS — Z1231 Encounter for screening mammogram for malignant neoplasm of breast: Secondary | ICD-10-CM | POA: Diagnosis not present

## 2019-11-08 DIAGNOSIS — H401112 Primary open-angle glaucoma, right eye, moderate stage: Secondary | ICD-10-CM | POA: Diagnosis not present

## 2019-11-08 DIAGNOSIS — H401122 Primary open-angle glaucoma, left eye, moderate stage: Secondary | ICD-10-CM | POA: Diagnosis not present

## 2020-01-11 DIAGNOSIS — R079 Chest pain, unspecified: Secondary | ICD-10-CM | POA: Diagnosis not present

## 2020-01-11 DIAGNOSIS — E041 Nontoxic single thyroid nodule: Secondary | ICD-10-CM | POA: Diagnosis not present

## 2020-01-11 DIAGNOSIS — Z20822 Contact with and (suspected) exposure to covid-19: Secondary | ICD-10-CM | POA: Diagnosis not present

## 2020-01-11 DIAGNOSIS — R918 Other nonspecific abnormal finding of lung field: Secondary | ICD-10-CM | POA: Diagnosis not present

## 2020-01-11 DIAGNOSIS — R0789 Other chest pain: Secondary | ICD-10-CM | POA: Diagnosis not present

## 2020-01-23 ENCOUNTER — Encounter: Payer: Self-pay | Admitting: Family Medicine

## 2020-01-23 ENCOUNTER — Other Ambulatory Visit: Payer: Self-pay

## 2020-01-23 ENCOUNTER — Ambulatory Visit (INDEPENDENT_AMBULATORY_CARE_PROVIDER_SITE_OTHER): Payer: Medicare Other | Admitting: Family Medicine

## 2020-01-23 VITALS — BP 122/82 | HR 78 | Temp 97.1°F | Resp 18 | Ht 66.0 in | Wt 143.6 lb

## 2020-01-23 DIAGNOSIS — R0789 Other chest pain: Secondary | ICD-10-CM | POA: Diagnosis not present

## 2020-01-23 DIAGNOSIS — L700 Acne vulgaris: Secondary | ICD-10-CM

## 2020-01-23 DIAGNOSIS — E041 Nontoxic single thyroid nodule: Secondary | ICD-10-CM | POA: Diagnosis not present

## 2020-01-23 DIAGNOSIS — Z23 Encounter for immunization: Secondary | ICD-10-CM | POA: Diagnosis not present

## 2020-01-23 MED ORDER — DOXYCYCLINE HYCLATE 100 MG PO TABS: 100.0000 mg | ORAL_TABLET | Freq: Every day | ORAL | 0 refills | Status: DC

## 2020-01-23 NOTE — Patient Instructions (Addendum)
Order thyroid ultrasound If chest pain occurs, pt to take four baby aspirin 81 mg as needed for chest pain.  Acne: Doxycycline 100 mg once daily x 30 days.  May use oxy otc.

## 2020-01-23 NOTE — Progress Notes (Signed)
Subjective:  Patient ID: Monique Atkins, female    DOB: Mar 20, 1946  Age: 74 y.o. MRN: [id number]  Chief Complaint  Patient presents with  . Hospitalization Follow-up    HPI Complaining of chest pain for 10 minutes and went to [facility] ED. 8/10. Left arm has been sore since her second covid shot. No nausea, vomiting, dyspnea. Given baby aspirin x 4. Had gone away prior to arriving. Troponin was normal. D Dimer elevated. CTA chest: no PE. Chest pain has not happened again. Did not have heart burn. Incidental thyroid nodule on cta. Had stress test 10 years ago which was normal. No risk factors for cardiac disease.  No current outpatient medications on file prior to visit.   No current facility-administered medications on file prior to visit.   Past Medical History:  Diagnosis Date  . Atypical ductal hyperplasia, breast    Past Surgical History:  Procedure Laterality Date  . COLONOSCOPY    . RADIOACTIVE SEED GUIDED EXCISIONAL BREAST BIOPSY Left 03/23/2015   Procedure: RADIOACTIVE SEED GUIDED EXCISIONAL LEFT BREAST BIOPSY;  Surgeon: Rolm Bookbinder, MD;  Location: [facility];  Service: General;  Laterality: Left;  . WISDOM TOOTH EXTRACTION      Family History  Problem Relation Age of Onset  . Stroke Mother   . Osteoarthritis Mother   . Deep vein thrombosis Mother   . Neuropathy Mother   . Arthritis/Rheumatoid Sister    Social History   Socioeconomic History  . Marital status: Widowed    Spouse name: Not on file  . Number of children: Not on file  . Years of education: Not on file  . Highest education level: Not on file  Occupational History  . Not on file  Tobacco Use  . Smoking status: Never Smoker  . Smokeless tobacco: Never Used  Substance and Sexual Activity  . Alcohol use: Yes    Alcohol/week: 1.0 standard drink    Types: 1 Glasses of wine per week    Comment: occas wine  . Drug use: Never  . Sexual activity: Not on file    Other Topics Concern  . Not on file  Social History Narrative  . Not on file   Social Determinants of Health   Financial Resource Strain:   . Difficulty of Paying Living Expenses: Not on file  Food Insecurity:   . Worried About Charity fundraiser in the Last Year: Not on file  . Ran Out of Food in the Last Year: Not on file  Transportation Needs:   . Lack of Transportation (Medical): Not on file  . Lack of Transportation (Non-Medical): Not on file  Physical Activity:   . Days of Exercise per Week: Not on file  . Minutes of Exercise per Session: Not on file  Stress:   . Feeling of Stress : Not on file  Social Connections:   . Frequency of Communication with Friends and Family: Not on file  . Frequency of Social Gatherings with Friends and Family: Not on file  . Attends Religious Services: Not on file  . Active Member of Clubs or Organizations: Not on file  . Attends Archivist Meetings: Not on file  . Marital Status: Not on file    Review of Systems  Constitutional: Negative for chills, fatigue and fever.  HENT: Negative for congestion, ear pain and sore throat.   Respiratory: Negative for cough and shortness of breath.   Cardiovascular: Negative for chest pain.  Gastrointestinal: Positive for abdominal pain, constipation (stools alternate. ) and diarrhea. Negative for nausea and vomiting.       Symptoms have resolved.  Genitourinary: Negative for dysuria and urgency.  Musculoskeletal: Negative for arthralgias and myalgias.  Skin: Negative for rash.  Neurological: Negative for dizziness and headaches.  Psychiatric/Behavioral: Negative for dysphoric mood. The patient is not nervous/anxious.      Objective:  BP 122/82   Pulse 78   Temp (!) 97.1 F (36.2 C)   Resp 18   Ht 5\' 6"  (1.676 m)   Wt 143 lb 9.6 oz (65.1 kg)   BMI 23.18 kg/m   BP/Weight 01/23/2020 15/17/6160  Systolic BP 737 106  Diastolic BP 82 87  Wt. (Lbs) 143.6 147  BMI 23.18 23.02     Physical Exam Vitals reviewed.  Constitutional:      Appearance: Normal appearance. She is normal weight.  Neck:     Thyroid: No thyroid mass, thyromegaly or thyroid tenderness.     Comments: NO THYROID NODULES FELT.  Cardiovascular:     Rate and Rhythm: Normal rate and regular rhythm.     Heart sounds: Normal heart sounds.  Pulmonary:     Effort: Pulmonary effort is normal.     Breath sounds: Normal breath sounds.  Abdominal:     General: Bowel sounds are normal.     Palpations: Abdomen is soft.     Tenderness: There is no abdominal tenderness.  Lymphadenopathy:     Cervical: No cervical adenopathy.  Neurological:     Mental Status: She is alert.     Diabetic Foot Exam - Simple   No data filed       Lab Results  Component Value Date   WBC 6.0 08/10/2006   HGB 14.3 08/10/2006   HCT 41.4 08/10/2006   PLT 269 08/10/2006   GLUCOSE 93 08/10/2006   CHOL 186 08/10/2006   TRIG 91 08/10/2006   HDL 114.0 08/10/2006   LDLCALC 54 08/10/2006   ALT 22 08/10/2006   AST 21 08/10/2006   NA 141 08/10/2006   K 3.8 08/10/2006   CL 102 08/10/2006   CREATININE 1.0 08/10/2006   BUN 12 08/10/2006   CO2 32 08/10/2006   TSH 2.37 08/10/2006      Assessment & Plan:  1. Other chest pain Noncardiac. Monitor. If recurs, pt to take four baby aspirin and call and will consider stress test or cardiology referral.   2. Thyroid nodule - US THYROID; Future  3. Need for immunization against influenza - Flu Vaccine QUAD High Dose(Fluad)  4. Acne vulgaris - Start on doxycycline 100 mg once daily x 30 days. Use otc oxy topical.  Follow-up: Return for [facility] VISIT. Marland Kitchen  An After Visit Summary was printed and given to the patient.  Rochel Brome [facility] [phone]

## 2020-01-26 ENCOUNTER — Encounter: Payer: Self-pay | Admitting: Family Medicine

## 2020-01-31 DIAGNOSIS — E041 Nontoxic single thyroid nodule: Secondary | ICD-10-CM | POA: Diagnosis not present

## 2020-02-03 ENCOUNTER — Other Ambulatory Visit: Payer: Self-pay

## 2020-02-03 DIAGNOSIS — E041 Nontoxic single thyroid nodule: Secondary | ICD-10-CM

## 2020-02-14 ENCOUNTER — Encounter: Payer: Self-pay | Admitting: Family Medicine

## 2020-02-14 ENCOUNTER — Other Ambulatory Visit: Payer: Self-pay

## 2020-02-14 ENCOUNTER — Ambulatory Visit (INDEPENDENT_AMBULATORY_CARE_PROVIDER_SITE_OTHER): Payer: Medicare Other | Admitting: Family Medicine

## 2020-02-14 VITALS — BP 140/78 | HR 55 | Temp 97.3°F | Ht 66.0 in | Wt 144.0 lb

## 2020-02-14 DIAGNOSIS — R001 Bradycardia, unspecified: Secondary | ICD-10-CM

## 2020-02-14 DIAGNOSIS — R0789 Other chest pain: Secondary | ICD-10-CM | POA: Diagnosis not present

## 2020-02-14 MED ORDER — KETOROLAC TROMETHAMINE 60 MG/2ML IM SOLN
60.0000 mg | Freq: Once | INTRAMUSCULAR | Status: AC
  Administered 2020-02-14: 60 mg via INTRAMUSCULAR

## 2020-02-14 MED ORDER — NAPROXEN 500 MG PO TABS: 500.0000 mg | ORAL_TABLET | Freq: Two times a day (BID) | ORAL | 0 refills | Status: DC

## 2020-02-14 NOTE — Progress Notes (Signed)
Cancelled. Kc

## 2020-02-14 NOTE — Progress Notes (Signed)
Acute Office Visit  Subjective:    Patient ID: Monique Atkins, female    DOB: 1945-10-02, 74 y.o.   MRN: [id number]  Chief Complaint  Patient presents with  . Chest Pain    HPI Patient is in today for chest pain for the past two days. Radiating pain comes and goes. Last night pain woke her up however is unsure how long it lasted. Has only taken ibuprofen. Took twice and did not help No new exercises or weights, only uses 10 lb weights. Does not hurt with breath. No dizziness.  Past Medical History:  Diagnosis Date  . Atypical ductal hyperplasia, breast     Past Surgical History:  Procedure Laterality Date  . COLONOSCOPY    . RADIOACTIVE SEED GUIDED EXCISIONAL BREAST BIOPSY Left 03/23/2015   Procedure: RADIOACTIVE SEED GUIDED EXCISIONAL LEFT BREAST BIOPSY;  Surgeon: Rolm Bookbinder, MD;  Location: [place];  Service: General;  Laterality: Left;  . WISDOM TOOTH EXTRACTION      Family History  Problem Relation Age of Onset  . Stroke Mother   . Osteoarthritis Mother   . Deep vein thrombosis Mother   . Neuropathy Mother   . Arthritis/Rheumatoid Sister     Social History   Socioeconomic History  . Marital status: Widowed    Spouse name: Not on file  . Number of children: Not on file  . Years of education: Not on file  . Highest education level: Not on file  Occupational History  . Not on file  Tobacco Use  . Smoking status: Never Smoker  . Smokeless tobacco: Never Used  Substance and Sexual Activity  . Alcohol use: Yes    Alcohol/week: 1.0 standard drink    Types: 1 Glasses of wine per week    Comment: occas wine  . Drug use: Never  . Sexual activity: Not on file  Other Topics Concern  . Not on file  Social History Narrative  . Not on file   Social Determinants of Health   Financial Resource Strain:   . Difficulty of Paying Living Expenses: Not on file  Food Insecurity:   . Worried About Charity fundraiser in the Last Year:  Not on file  . Ran Out of Food in the Last Year: Not on file  Transportation Needs:   . Lack of Transportation (Medical): Not on file  . Lack of Transportation (Non-Medical): Not on file  Physical Activity:   . Days of Exercise per Week: Not on file  . Minutes of Exercise per Session: Not on file  Stress:   . Feeling of Stress : Not on file  Social Connections:   . Frequency of Communication with Friends and Family: Not on file  . Frequency of Social Gatherings with Friends and Family: Not on file  . Attends Religious Services: Not on file  . Active Member of Clubs or Organizations: Not on file  . Attends Archivist Meetings: Not on file  . Marital Status: Not on file  Intimate Partner Violence:   . Fear of Current or Ex-Partner: Not on file  . Emotionally Abused: Not on file  . Physically Abused: Not on file  . Sexually Abused: Not on file    Outpatient Medications Prior to Visit  Medication Sig Dispense Refill  . doxycycline (VIBRA-TABS) 100 MG tablet Take 1 tablet (100 mg total) by mouth daily. 30 tablet 0   No facility-administered medications prior to visit.    No Known  Allergies  Review of Systems  Constitutional: Negative for fever.  HENT: Negative for congestion.   Respiratory: Negative for cough and shortness of breath.   Cardiovascular: Positive for chest pain (achy. radiated to right arm and right jaw pain.).  Gastrointestinal: Negative for abdominal pain.       Objective:    Physical Exam Vitals reviewed.  Constitutional:      Appearance: Normal appearance. She is normal weight.  Cardiovascular:     Rate and Rhythm: Regular rhythm. Bradycardia present.     Pulses: Normal pulses.     Heart sounds: Normal heart sounds.  Pulmonary:     Effort: Pulmonary effort is normal. No respiratory distress.     Breath sounds: Normal breath sounds.  Chest:     Chest wall: No deformity or tenderness.  Abdominal:     General: Abdomen is flat.      Tenderness: There is no abdominal tenderness.  Neurological:     Mental Status: She is alert and oriented to person, place, and time.  Psychiatric:        Mood and Affect: Mood normal.        Behavior: Behavior normal.     BP 140/78   Pulse (!) 55   Temp (!) 97.3 F (36.3 C)   Ht 5\' 6"  (1.676 m)   Wt 144 lb (65.3 kg)   SpO2 99%   BMI 23.24 kg/m  Wt Readings from Last 3 Encounters:  02/14/20 144 lb (65.3 kg)  01/23/20 143 lb 9.6 oz (65.1 kg)  03/23/15 147 lb (66.7 kg)    Health Maintenance Due  Topic Date Due  . Hepatitis C Screening  Never done  . COVID-19 Vaccine (1) Never done  . TETANUS/TDAP  Never done    There are no preventive care reminders to display for this patient.   Lab Results  Component Value Date   TSH 2.37 08/10/2006   Lab Results  Component Value Date   WBC 6.0 08/10/2006   HGB 14.3 08/10/2006   HCT 41.4 08/10/2006   MCV 87.1 08/10/2006   PLT 269 08/10/2006   Lab Results  Component Value Date   NA 141 08/10/2006   K 3.8 08/10/2006   CO2 32 08/10/2006   GLUCOSE 93 08/10/2006   BUN 12 08/10/2006   CREATININE 1.0 08/10/2006   BILITOT 0.8 08/10/2006   ALKPHOS 75 08/10/2006   AST 21 08/10/2006   ALT 22 08/10/2006   PROT 7.3 08/10/2006   ALBUMIN 4.1 08/10/2006   CALCIUM 9.7 08/10/2006   Lab Results  Component Value Date   CHOL 186 08/10/2006   Lab Results  Component Value Date   HDL 114.0 08/10/2006   Lab Results  Component Value Date   LDLCALC 54 08/10/2006   Lab Results  Component Value Date   TRIG 91 08/10/2006   Lab Results  Component Value Date   CHOLHDL 1.6 CALC 08/10/2006   No results found for: HGBA1C     Assessment & Plan:  1. Other chest pain Trial on naproxen 500 bid. No ibuprofen.  Toradol shot given. - EKG 12-Lead: sinus bradycardia. - Ambulatory referral to Cardiology  2. Bradycardia Rate 56. Refer to cardiology.   Meds ordered this encounter  Medications  . naproxen (NAPROSYN) 500 MG tablet     Sig: Take 1 tablet (500 mg total) by mouth 2 (two) times daily with a meal.    Dispense:  60 tablet    Refill:  0  . ketorolac (  TORADOL) injection 60 mg    Orders Placed This Encounter  Procedures  . Ambulatory referral to Cardiology  . EKG 12-Lead     Follow-up: No follow-ups on file.  An After Visit Summary was printed and given to the patient.  Rochel Brome [facility] [phone]

## 2020-02-14 NOTE — Patient Instructions (Signed)
Toradol shot given.  Cardiology referral.  Start on naproxen 500 mg one twice a day for 2 weeks.

## 2020-02-18 ENCOUNTER — Other Ambulatory Visit: Payer: Self-pay | Admitting: Family Medicine

## 2020-02-19 DIAGNOSIS — N6099 Unspecified benign mammary dysplasia of unspecified breast: Secondary | ICD-10-CM | POA: Insufficient documentation

## 2020-02-20 ENCOUNTER — Other Ambulatory Visit: Payer: Self-pay

## 2020-02-20 ENCOUNTER — Ambulatory Visit (INDEPENDENT_AMBULATORY_CARE_PROVIDER_SITE_OTHER): Payer: Medicare Other | Admitting: Cardiology

## 2020-02-20 ENCOUNTER — Encounter: Payer: Self-pay | Admitting: Cardiology

## 2020-02-20 VITALS — BP 134/83 | HR 65 | Ht 66.0 in | Wt 145.4 lb

## 2020-02-20 DIAGNOSIS — Z23 Encounter for immunization: Secondary | ICD-10-CM | POA: Diagnosis not present

## 2020-02-20 DIAGNOSIS — R072 Precordial pain: Secondary | ICD-10-CM | POA: Insufficient documentation

## 2020-02-20 DIAGNOSIS — R0789 Other chest pain: Secondary | ICD-10-CM | POA: Diagnosis not present

## 2020-02-20 MED ORDER — METOPROLOL TARTRATE 100 MG PO TABS: 100.0000 mg | ORAL_TABLET | ORAL | 0 refills | Status: DC

## 2020-02-20 NOTE — Progress Notes (Signed)
Cardiology Office Note:    Date:  02/20/2020   ID:  Monique Atkins, DOB [birth year], MRN [id number]  PCP:  Rochel Brome, MD  Cardiologist:  No primary care provider on file.  Electrophysiologist:  None   Referring MD: Rochel Brome, MD    Follow up visit   History of Present Illness:    Monique Atkins is a 74 y.o. female with a hx of atypical ductal hyperplasia of the breast status post excisional breast biopsy with radioactive seed presents today to be evaluated for chest pain.  Patient tells me over the last several months she has had intermittent dull midsternal chest pain.  She states that few weeks ago she was at [facility] when she had this chest pain she was taken to the emergency department at which time her EKG was normal her D-dimer was elevated she had a CT of the chest with did not show any pulmonary embolism however suggested that she may have some thyroid nodule.  Her troponin was normal.  When she came back she saw her PCP to repeat a thyroid ultrasound this was normal. In the meantime she still started experience intermittent chest pain.  Dull substernal nonradiating.  This is concerning for her.  No other symptoms  Past Medical History:  Diagnosis Date  . Atypical ductal hyperplasia of breast 03/17/2015  . Atypical ductal hyperplasia, breast     Past Surgical History:  Procedure Laterality Date  . COLONOSCOPY    . RADIOACTIVE SEED GUIDED EXCISIONAL BREAST BIOPSY Left 03/23/2015   Procedure: RADIOACTIVE SEED GUIDED EXCISIONAL LEFT BREAST BIOPSY;  Surgeon: Rolm Bookbinder, MD;  Location: [place];  Service: General;  Laterality: Left;  . WISDOM TOOTH EXTRACTION      Current Medications: No outpatient medications have been marked as taking for the 02/20/20 encounter (Office Visit) with Berniece Salines, DO.     Allergies:   Patient has no known allergies.   Social History   Socioeconomic History  . Marital status: Widowed     Spouse name: Not on file  . Number of children: Not on file  . Years of education: Not on file  . Highest education level: Not on file  Occupational History  . Not on file  Tobacco Use  . Smoking status: Never Smoker  . Smokeless tobacco: Never Used  Substance and Sexual Activity  . Alcohol use: Yes    Alcohol/week: 1.0 standard drink    Types: 1 Glasses of wine per week    Comment: occas wine  . Drug use: Never  . Sexual activity: Not on file  Other Topics Concern  . Not on file  Social History Narrative  . Not on file   Social Determinants of Health   Financial Resource Strain:   . Difficulty of Paying Living Expenses: Not on file  Food Insecurity:   . Worried About Charity fundraiser in the Last Year: Not on file  . Ran Out of Food in the Last Year: Not on file  Transportation Needs:   . Lack of Transportation (Medical): Not on file  . Lack of Transportation (Non-Medical): Not on file  Physical Activity:   . Days of Exercise per Week: Not on file  . Minutes of Exercise per Session: Not on file  Stress:   . Feeling of Stress : Not on file  Social Connections:   . Frequency of Communication with Friends and Family: Not on file  . Frequency of Social Gatherings with  Friends and Family: Not on file  . Attends Religious Services: Not on file  . Active Member of Clubs or Organizations: Not on file  . Attends Archivist Meetings: Not on file  . Marital Status: Not on file     Family History: The patient's family history includes Arthritis/Rheumatoid in her sister; Deep vein thrombosis in her mother; Neuropathy in her mother; Osteoarthritis in her mother; Stroke in her mother.  ROS:   Review of Systems  Constitution: Negative for decreased appetite, fever and weight gain.  HENT: Negative for congestion, ear discharge, hoarse voice and sore throat.   Eyes: Negative for discharge, redness, vision loss in right eye and visual halos.  Cardiovascular: Negative  for chest pain, dyspnea on exertion, leg swelling, orthopnea and palpitations.  Respiratory: Negative for cough, hemoptysis, shortness of breath and snoring.   Endocrine: Negative for heat intolerance and polyphagia.  Hematologic/Lymphatic: Negative for bleeding problem. Does not bruise/bleed easily.  Skin: Negative for flushing, nail changes, rash and suspicious lesions.  Musculoskeletal: Negative for arthritis, joint pain, muscle cramps, myalgias, neck pain and stiffness.  Gastrointestinal: Negative for abdominal pain, bowel incontinence, diarrhea and excessive appetite.  Genitourinary: Negative for decreased libido, genital sores and incomplete emptying.  Neurological: Negative for brief paralysis, focal weakness, headaches and loss of balance.  Psychiatric/Behavioral: Negative for altered mental status, depression and suicidal ideas.  Allergic/Immunologic: Negative for HIV exposure and persistent infections.    EKGs/Labs/Other Studies Reviewed:    The following studies were reviewed today:   EKG:  The ekg ordered today demonstrates sinus rhythm, heart rate is 65 bpm.  Recent Labs: No results found for requested labs within last 8760 hours.  Recent Lipid Panel    Component Value Date/Time   CHOL 186 08/10/2006 1600   TRIG 91 08/10/2006 1600   HDL 114.0 08/10/2006 1600   CHOLHDL 1.6 CALC 08/10/2006 1600   VLDL 18 08/10/2006 1600   LDLCALC 54 08/10/2006 1600    Physical Exam:    VS:  BP 134/83 (BP Location: Right Arm)   Pulse 65   Ht 5' 6"  (1.676 m)   Wt 145 lb 6.4 oz (66 kg)   SpO2 98%   BMI 23.47 kg/m     Wt Readings from Last 3 Encounters:  02/20/20 145 lb 6.4 oz (66 kg)  02/14/20 144 lb (65.3 kg)  01/23/20 143 lb 9.6 oz (65.1 kg)     GEN: Well nourished, well developed in no acute distress HEENT: Normal NECK: No JVD; No carotid bruits LYMPHATICS: No lymphadenopathy CARDIAC: S1S2 noted,RRR, no murmurs, rubs, gallops RESPIRATORY:  Clear to auscultation without  rales, wheezing or rhonchi  ABDOMEN: Soft, non-tender, non-distended, +bowel sounds, no guarding. EXTREMITIES: No edema, No cyanosis, no clubbing MUSCULOSKELETAL:  No deformity  SKIN: Warm and dry NEUROLOGIC:  Alert and oriented x 3, non-focal PSYCHIATRIC:  Normal affect, good insight  ASSESSMENT:    1. Precordial pain    PLAN:     Also get a coronary CTA in this patient.  2 stratify her risk.  Her symptoms are concerning she does have intermediate risk factors.  I have educated patient about her symptoms she is agreeable to proceed.  In the meantime she will go to the emergency room if the pain recurs and persists.  Also keep an eye on her blood pressure.  As is slightly elevated in the office today but still within range closer to 130/80 mmHg.  The patient is in agreement with the above plan.  The patient left the office in stable condition.  The patient will follow up in 3 months or sooner if needed.   Medication Adjustments/Labs and Tests Ordered: Current medicines are reviewed at length with the patient today.  Concerns regarding medicines are outlined above.  No orders of the defined types were placed in this encounter.  No orders of the defined types were placed in this encounter.   Patient Instructions  Medication Instructions:  Your physician recommends that you continue on your current medications as directed. Please refer to the Current Medication list given to you today.  *If you need a refill on your cardiac medications before your next appointment, please call your pharmacy*   Lab Work: Your physician recommends that you return for lab work 1 week before CT  If you have labs (blood work) drawn today and your tests are completely normal, you will receive your results only by: Marland Kitchen MyChart Message (if you have MyChart) OR . A paper copy in the mail If you have any lab test that is abnormal or we need to change your treatment, we will call you to review the  results.   Testing/Procedures: Non-Cardiac CT Angiography (CTA), is a special type of CT scan that uses a computer to produce multi-dimensional views of major blood vessels throughout the body. In CT angiography, a contrast material is injected through an IV to help visualize the blood vessels   Follow-Up: At [facility], you and your health needs are our priority.  As part of our continuing mission to provide you with exceptional heart care, we have created designated Provider Care Teams.  These Care Teams include your primary Cardiologist (physician) and Advanced Practice Providers (APPs -  Physician Assistants and Nurse Practitioners) who all work together to provide you with the care you need, when you need it.  We recommend signing up for the patient portal called "MyChart".  Sign up information is provided on this After Visit Summary.  MyChart is used to connect with patients for Virtual Visits (Telemedicine).  Patients are able to view lab/test results, encounter notes, upcoming appointments, etc.  Non-urgent messages can be sent to your provider as well.   To learn more about what you can do with MyChart, go to [email or web address].    Your next appointment:   3 month(s)  The format for your next appointment:   In Person  Provider:   Berniece Salines, DO   Other Instructions Your cardiac CT will be scheduled at one of the below locations:   [facility] [address] [place], [place] [address] [phone]   If scheduled at [facility], please arrive at the [facility] main entrance of [facility] 30 minutes prior to test start time. Proceed to the [facility] Radiology Department (first floor) to check-in and test prep.  Please follow these instructions carefully (unless otherwise directed):    On the Night Before the Test: . Be sure to Drink plenty of water. . Do not consume any caffeinated/decaffeinated beverages or chocolate 12 hours  prior to your test. . Do not take any antihistamines 12 hours prior to your test.  On the Day of the Test: . Drink plenty of water. Do not drink any water within one hour of the test. . Do not eat any food 4 hours prior to the test. . You may take your regular medications prior to the test.  . Take metoprolol (Lopressor) two hours prior to test. . FEMALES- please  wear underwire-free bra if available      After the Test: . Drink plenty of water. . After receiving IV contrast, you may experience a mild flushed feeling. This is normal. . On occasion, you may experience a mild rash up to 24 hours after the test. This is not dangerous. If this occurs, you can take Benadryl 25 mg and increase your fluid intake. . If you experience trouble breathing, this can be serious. If it is severe call 911 IMMEDIATELY. If it is mild, please call our office.   Once we have confirmed authorization from your insurance company, we will call you to set up a date and time for your test. Based on how quickly your insurance processes prior authorizations requests, please allow up to 4 weeks to be contacted for scheduling your Cardiac CT appointment. Be advised that routine Cardiac CT appointments could be scheduled as many as 8 weeks after your provider has ordered it.  For non-scheduling related questions, please contact the cardiac imaging nurse navigator should you have any questions/concerns: Marchia Bond, Cardiac Imaging Nurse Navigator Burley Saver, Interim Cardiac Imaging Nurse [place] and Vascular Services Direct Office Dial: [phone]   For scheduling needs, including cancellations and rescheduling, please call Vivien Rota at [phone], option 3.        Adopting a Healthy Lifestyle.  Know what a healthy weight is for you (roughly BMI <25) and aim to maintain this   Aim for 7+ servings of fruits and vegetables daily   65-80+ fluid ounces of water or unsweet tea for healthy kidneys    Limit to max 1 drink of alcohol per day; avoid smoking/tobacco   Limit animal fats in diet for cholesterol and heart health - choose grass fed whenever available   Avoid highly processed foods, and foods high in saturated/trans fats   Aim for low stress - take time to unwind and care for your mental health   Aim for 150 min of moderate intensity exercise weekly for heart health, and weights twice weekly for bone health   Aim for 7-9 hours of sleep daily   When it comes to diets, agreement about the perfect plan isnt easy to find, even among the experts. Experts at the [employer] developed an idea known as the Healthy Eating Plate. Just imagine a plate divided into logical, healthy portions.   The emphasis is on diet quality:   Load up on vegetables and fruits - one-half of your plate: Aim for color and variety, and remember that potatoes dont count.   Go for whole grains - one-quarter of your plate: Whole wheat, barley, wheat berries, quinoa, oats, brown rice, and foods made with them. If you want pasta, go with whole wheat pasta.   Protein power - one-quarter of your plate: Fish, chicken, beans, and nuts are all healthy, versatile protein sources. Limit red meat.   The diet, however, does go beyond the plate, offering a few other suggestions.   Use healthy plant oils, such as olive, canola, soy, corn, sunflower and peanut. Check the labels, and avoid partially hydrogenated oil, which have unhealthy trans fats.   If youre thirsty, drink water. Coffee and tea are good in moderation, but skip sugary drinks and limit milk and dairy products to one or two daily servings.   The type of carbohydrate in the diet is more important than the amount. Some sources of carbohydrates, such as vegetables, fruits, whole grains, and beans-are healthier than others.  Finally, stay active  Signed, Berniece Salines, DO  02/20/2020 3:11 PM    [facility]

## 2020-02-20 NOTE — Patient Instructions (Addendum)
Medication Instructions:  Your physician recommends that you continue on your current medications as directed. Please refer to the Current Medication list given to you today.  *If you need a refill on your cardiac medications before your next appointment, please call your pharmacy*   Lab Work: Your physician recommends that you return for lab work 1 week before CT  If you have labs (blood work) drawn today and your tests are completely normal, you will receive your results only by: Marland Kitchen MyChart Message (if you have MyChart) OR . A paper copy in the mail If you have any lab test that is abnormal or we need to change your treatment, we will call you to review the results.   Testing/Procedures: Non-Cardiac CT Angiography (CTA), is a special type of CT scan that uses a computer to produce multi-dimensional views of major blood vessels throughout the body. In CT angiography, a contrast material is injected through an IV to help visualize the blood vessels   Follow-Up: At [facility], you and your health needs are our priority.  As part of our continuing mission to provide you with exceptional heart care, we have created designated Provider Care Teams.  These Care Teams include your primary Cardiologist (physician) and Advanced Practice Providers (APPs -  Physician Assistants and Nurse Practitioners) who all work together to provide you with the care you need, when you need it.  We recommend signing up for the patient portal called "MyChart".  Sign up information is provided on this After Visit Summary.  MyChart is used to connect with patients for Virtual Visits (Telemedicine).  Patients are able to view lab/test results, encounter notes, upcoming appointments, etc.  Non-urgent messages can be sent to your provider as well.   To learn more about what you can do with MyChart, go to [email or web address].    Your next appointment:   3 month(s)  The format for your next appointment:   In  Person  Provider:   Berniece Salines, DO   Other Instructions Your cardiac CT will be scheduled at one of the below locations:   [facility] [address] [place], [place] [address] [phone]   If scheduled at [facility], please arrive at the [facility] main entrance of [facility] 30 minutes prior to test start time. Proceed to the [facility] Radiology Department (first floor) to check-in and test prep.  Please follow these instructions carefully (unless otherwise directed):    On the Night Before the Test: . Be sure to Drink plenty of water. . Do not consume any caffeinated/decaffeinated beverages or chocolate 12 hours prior to your test. . Do not take any antihistamines 12 hours prior to your test.  On the Day of the Test: . Drink plenty of water. Do not drink any water within one hour of the test. . Do not eat any food 4 hours prior to the test. . You may take your regular medications prior to the test.  . Take metoprolol (Lopressor) two hours prior to test. . FEMALES- please wear underwire-free bra if available      After the Test: . Drink plenty of water. . After receiving IV contrast, you may experience a mild flushed feeling. This is normal. . On occasion, you may experience a mild rash up to 24 hours after the test. This is not dangerous. If this occurs, you can take Benadryl 25 mg and increase your fluid intake. . If you experience trouble breathing, this can  be serious. If it is severe call 911 IMMEDIATELY. If it is mild, please call our office.   Once we have confirmed authorization from your insurance company, we will call you to set up a date and time for your test. Based on how quickly your insurance processes prior authorizations requests, please allow up to 4 weeks to be contacted for scheduling your Cardiac CT appointment. Be advised that routine Cardiac CT appointments could be scheduled as many as 8 weeks after your provider  has ordered it.  For non-scheduling related questions, please contact the cardiac imaging nurse navigator should you have any questions/concerns: Marchia Bond, Cardiac Imaging Nurse Navigator Burley Saver, Interim Cardiac Imaging Nurse [place] and Vascular Services Direct Office Dial: [phone]   For scheduling needs, including cancellations and rescheduling, please call Vivien Rota at [phone], option 3.

## 2020-02-27 ENCOUNTER — Ambulatory Visit: Payer: Medicare Other | Admitting: Family Medicine

## 2020-03-09 ENCOUNTER — Other Ambulatory Visit: Payer: Self-pay | Admitting: Family Medicine

## 2020-03-09 DIAGNOSIS — R072 Precordial pain: Secondary | ICD-10-CM | POA: Diagnosis not present

## 2020-03-10 ENCOUNTER — Telehealth: Payer: Self-pay

## 2020-03-10 LAB — BASIC METABOLIC PANEL
BUN/Creatinine Ratio: 17 (ref 12–28)
BUN: 14 mg/dL (ref 8–27)
CO2: 27 mmol/L (ref 20–29)
Calcium: 10 mg/dL (ref 8.7–10.3)
Chloride: 102 mmol/L (ref 96–106)
Creatinine, Ser: 0.84 mg/dL (ref 0.57–1.00)
GFR calc Af Amer: 80 mL/min/{1.73_m2} (ref >59)
GFR calc non Af Amer: 70 mL/min/{1.73_m2} (ref >59)
Glucose: 83 mg/dL (ref 65–99)
Potassium: 4.1 mmol/L (ref 3.5–5.2)
Sodium: 141 mmol/L (ref 134–144)

## 2020-03-10 NOTE — Telephone Encounter (Signed)
Spoke with patient regarding results and recommendation.  Patient verbalizes understanding and is agreeable to plan of care. Advised patient to call back with any issues or concerns.  

## 2020-03-10 NOTE — Telephone Encounter (Signed)
-----   Message from Berniece Salines, DO sent at 03/10/2020 12:05 PM EDT ----- Labs normal

## 2020-03-16 ENCOUNTER — Other Ambulatory Visit: Payer: Self-pay | Admitting: Family Medicine

## 2020-03-17 ENCOUNTER — Telehealth (HOSPITAL_COMMUNITY): Payer: Self-pay | Admitting: *Deleted

## 2020-03-17 NOTE — Telephone Encounter (Signed)
Reaching out to patient to offer assistance regarding upcoming cardiac imaging study; pt verbalizes understanding of appt date/time, parking situation and where to check in, pre-test NPO status and medications ordered, and verified current allergies; name and call back number provided for further questions should they arise  [facility] [phone] office [phone] cell  Since pt's HR can be as low as 55bpm, pt advised to take 16m of her metoprolol if HR is between 65-75bpm and 1063mif HR is greater than 75bpm.

## 2020-03-17 NOTE — Telephone Encounter (Signed)
Attempted to call patient regarding upcoming cardiac CT appointment. Left message on voicemail with name and callback number  Annarae Macnair Tai RN Navigator Cardiac Imaging [facility] [phone] Office [phone] Cell  

## 2020-03-18 ENCOUNTER — Encounter: Payer: Self-pay | Admitting: *Deleted

## 2020-03-18 ENCOUNTER — Other Ambulatory Visit: Payer: Self-pay

## 2020-03-18 ENCOUNTER — Ambulatory Visit (HOSPITAL_COMMUNITY)
Admission: RE | Admit: 2020-03-18 | Discharge: 2020-03-18 | Disposition: A | Discharge status: Home / Self Care | Payer: Medicare Other | Source: Ambulatory Visit | Attending: Cardiology | Admitting: Cardiology

## 2020-03-18 DIAGNOSIS — R072 Precordial pain: Secondary | ICD-10-CM

## 2020-03-18 DIAGNOSIS — Z006 Encounter for examination for normal comparison and control in clinical research program: Secondary | ICD-10-CM

## 2020-03-18 MED ORDER — NITROGLYCERIN 0.4 MG SL SUBL
SUBLINGUAL_TABLET | SUBLINGUAL | Status: AC
  Filled 2020-03-18: qty 2

## 2020-03-18 MED ORDER — NITROGLYCERIN 0.4 MG SL SUBL
0.8000 mg | SUBLINGUAL_TABLET | Freq: Once | SUBLINGUAL | Status: AC
  Administered 2020-03-18: 0.8 mg via SUBLINGUAL

## 2020-03-18 MED ORDER — IOHEXOL 350 MG/ML SOLN
100.0000 mL | Freq: Once | INTRAVENOUS | Status: AC | PRN
  Administered 2020-03-18: 100 mL via INTRAVENOUS

## 2020-03-18 NOTE — Research (Signed)
CADFEM Informed Consent                  Subject Name:   Monique Atkins   Subject met inclusion and exclusion criteria.  The informed consent form, study requirements and expectations were reviewed with the subject and questions and concerns were addressed prior to the signing of the consent form.  The subject verbalized understanding of the trial requirements.  The subject agreed to participate in the CADFEM trial and signed the informed consent.  The informed consent was obtained prior to performance of any protocol-specific procedures for the subject.  A copy of the signed informed consent was given to the subject and a copy was placed in the subject's medical record.   [place] Maigen Mozingo, Research Assistant  03/18/2020 09:08 a.m.

## 2020-03-19 ENCOUNTER — Telehealth: Payer: Self-pay

## 2020-03-19 NOTE — Telephone Encounter (Signed)
-----   Message from Berniece Salines, DO sent at 03/18/2020 11:00 PM EDT ----- Doristine Devoid news, calcium score is 0. No evidence of CAD.

## 2020-03-19 NOTE — Telephone Encounter (Signed)
Spoke with patient regarding results and recommendation.  Patient verbalizes understanding and is agreeable to plan of care. Advised patient to call back with any issues or concerns.  

## 2020-04-02 DIAGNOSIS — D1801 Hemangioma of skin and subcutaneous tissue: Secondary | ICD-10-CM | POA: Diagnosis not present

## 2020-04-02 DIAGNOSIS — L570 Actinic keratosis: Secondary | ICD-10-CM | POA: Diagnosis not present

## 2020-04-02 DIAGNOSIS — L821 Other seborrheic keratosis: Secondary | ICD-10-CM | POA: Diagnosis not present

## 2020-04-02 DIAGNOSIS — L658 Other specified nonscarring hair loss: Secondary | ICD-10-CM | POA: Diagnosis not present

## 2020-04-02 DIAGNOSIS — L818 Other specified disorders of pigmentation: Secondary | ICD-10-CM | POA: Diagnosis not present

## 2020-04-02 DIAGNOSIS — L700 Acne vulgaris: Secondary | ICD-10-CM | POA: Diagnosis not present

## 2020-04-02 DIAGNOSIS — L814 Other melanin hyperpigmentation: Secondary | ICD-10-CM | POA: Diagnosis not present

## 2020-04-14 ENCOUNTER — Encounter: Payer: Self-pay | Admitting: Family Medicine

## 2020-04-14 ENCOUNTER — Other Ambulatory Visit: Payer: Self-pay

## 2020-04-14 ENCOUNTER — Ambulatory Visit (INDEPENDENT_AMBULATORY_CARE_PROVIDER_SITE_OTHER): Payer: Medicare Other | Admitting: Family Medicine

## 2020-04-14 VITALS — BP 122/74 | Temp 97.5°F | Resp 18 | Ht 66.0 in | Wt 146.6 lb

## 2020-04-14 DIAGNOSIS — M810 Age-related osteoporosis without current pathological fracture: Secondary | ICD-10-CM | POA: Diagnosis not present

## 2020-04-14 NOTE — Patient Instructions (Addendum)
  Monique Atkins , Thank you for taking time to come for your Medicare Wellness Visit. I appreciate your ongoing commitment to your health goals. Please review the following plan we discussed and let me know if I can assist you in the future.   These are the goals we discussed:DEXA/mammogram Living Will    This is a list of the screening recommended for you and due dates:  Health Maintenance  Topic Date Due  .  Hepatitis C: One time screening is recommended by Center for Disease Control  (CDC) for  adults born from [birth year] through 1963.   Never done  . Tetanus Vaccine  Never done  . Mammogram  04/11/2021  . Colon Cancer Screening  04/05/2026  . Flu Shot  Completed  . DEXA scan (bone density measurement)  Completed  . COVID-19 Vaccine  Completed  . Pneumonia vaccines  Completed

## 2020-04-14 NOTE — Progress Notes (Signed)
Subjective:  Patient ID: Monique Atkins, female    DOB: 1945/10/10  Age: 74 y.o. MRN: [id number]  Chief Complaint  Patient presents with  . Annual Exam    Medicare    HPI Encounter for general adult medical examination without abnormal findings  Physical ("At Risk" items are starred): Patient's last physical exam was 1 year ago .  Smoking: Life-long non-smoker ;  Physical Activity: Exercises at least 3 times per week ;  Alcohol/Drug Use: Is a non-drinker ; No illicit drug use ;  Patient is not afflicted from Stress Incontinence and Urge Incontinence  Safety: reviewed ; Patient wears a seat belt, has smoke detectors, has carbon monoxide detectors, practices appropriate gun safety, and wears sunscreen with extended sun exposure. Dental Care: biannual cleanings, brushes and flosses daily. Ophthalmology/Optometry: Annual visit.  Hearing loss: none Vision impairments: none Needs mammogram/DEXA  Fall Risk  04/14/2020 02/14/2020 12/20/2018  Falls in the past year? 0 0 (No Data)  Comment - - Emmi Telephone Survey: data to providers prior to load  Number falls in past yr: 0 0 (No Data)  Comment - - Emmi Telephone Survey Actual Response =   Injury with Fall? 0 0 -  Risk for fall due to : No Fall Risks - -     Depression screen [facility] 2/9 04/14/2020 02/14/2020  Decreased Interest 0 0  Down, Depressed, Hopeless 0 0  PHQ - 2 Score 0 0       Functional Status Survey:   normal function  Social Hx   Social History   Socioeconomic History  . Marital status: Widowed    Spouse name: Not on file  . Number of children: Not on file  . Years of education: Not on file  . Highest education level: Not on file  Occupational History  . Not on file  Tobacco Use  . Smoking status: Never Smoker  . Smokeless tobacco: Never Used  Substance and Sexual Activity  . Alcohol use: Yes    Alcohol/week: 1.0 standard drink    Types: 1 Glasses of wine per week    Comment: occas wine  . Drug  use: Never  . Sexual activity: Not on file  Other Topics Concern  . Not on file  Social History Narrative  . Not on file   Social Determinants of Health   Financial Resource Strain:   . Difficulty of Paying Living Expenses: Not on file  Food Insecurity:   . Worried About Charity fundraiser in the Last Year: Not on file  . Ran Out of Food in the Last Year: Not on file  Transportation Needs:   . Lack of Transportation (Medical): Not on file  . Lack of Transportation (Non-Medical): Not on file  Physical Activity:   . Days of Exercise per Week: Not on file  . Minutes of Exercise per Session: Not on file  Stress:   . Feeling of Stress : Not on file  Social Connections:   . Frequency of Communication with Friends and Family: Not on file  . Frequency of Social Gatherings with Friends and Family: Not on file  . Attends Religious Services: Not on file  . Active Member of Clubs or Organizations: Not on file  . Attends Archivist Meetings: Not on file  . Marital Status: Not on file   Past Medical History:  Diagnosis Date  . Atypical ductal hyperplasia of breast 03/17/2015  . Atypical ductal hyperplasia, breast   . Glaucoma  Family History  Problem Relation Age of Onset  . Stroke Mother   . Osteoarthritis Mother   . Deep vein thrombosis Mother   . Neuropathy Mother   . Arthritis/Rheumatoid Sister      Objective:  BP 122/74   Temp (!) 97.5 F (36.4 C)   Resp 18   Ht 5\' 6"  (1.676 m)   Wt 146 lb 9.6 oz (66.5 kg)   BMI 23.66 kg/m   BP/Weight 04/14/2020 03/18/2020 8/33/8250  Systolic BP 539 767 341  Diastolic BP 74 91 83  Wt. (Lbs) 146.6 - 145.4  BMI 23.66 - 23.47     Lab Results  Component Value Date   WBC 6.0 08/10/2006   HGB 14.3 08/10/2006   HCT 41.4 08/10/2006   PLT 269 08/10/2006   GLUCOSE 83 03/09/2020   CHOL 186 08/10/2006   TRIG 91 08/10/2006   HDL 114.0 08/10/2006   LDLCALC 54 08/10/2006   ALT 22 08/10/2006   AST 21 08/10/2006   NA  141 03/09/2020   K 4.1 03/09/2020   CL 102 03/09/2020   CREATININE 0.84 03/09/2020   BUN 14 03/09/2020   CO2 27 03/09/2020   TSH 2.37 08/10/2006      Assessment & Plan:  These are the goals we discussed: DEXA-bone denisty Mammogram-pt prefers [facility] This is a list of the screening recommended for you and due dates:  Health Maintenance  Topic Date Due  .  Hepatitis C: One time screening is recommended by Center for Disease Control  (CDC) for  adults born from [birth year] through 1963.   Never done  . COVID-19 Vaccine (1) Never done  . Tetanus Vaccine  Never done  . Mammogram  04/11/2021  . Colon Cancer Screening  04/05/2026  . Flu Shot  Completed  . DEXA scan (bone density measurement)  Completed  . Pneumonia vaccines  Completed     AN INDIVIDUALIZED CARE PLAN: was established or reinforced today.   SELF MANAGEMENT: The patient and I together assessed ways to personally work towards obtaining the recommended goals  Follow-up: DEXA/mammogram/living will discussed Zechariah Bissonnette Hannah Beat, MD [place] [phone]

## 2020-05-08 ENCOUNTER — Other Ambulatory Visit: Payer: Self-pay

## 2020-05-08 DIAGNOSIS — M810 Age-related osteoporosis without current pathological fracture: Secondary | ICD-10-CM

## 2020-05-11 DIAGNOSIS — Z961 Presence of intraocular lens: Secondary | ICD-10-CM | POA: Diagnosis not present

## 2020-05-11 DIAGNOSIS — H401112 Primary open-angle glaucoma, right eye, moderate stage: Secondary | ICD-10-CM | POA: Diagnosis not present

## 2020-05-11 DIAGNOSIS — H52203 Unspecified astigmatism, bilateral: Secondary | ICD-10-CM | POA: Diagnosis not present

## 2020-05-26 ENCOUNTER — Ambulatory Visit: Payer: Medicare Other | Admitting: Cardiology

## 2020-05-29 ENCOUNTER — Encounter: Payer: Self-pay | Admitting: Family Medicine

## 2020-05-29 DIAGNOSIS — Z1231 Encounter for screening mammogram for malignant neoplasm of breast: Secondary | ICD-10-CM | POA: Diagnosis not present

## 2020-06-18 ENCOUNTER — Encounter: Payer: Self-pay | Admitting: *Deleted

## 2020-06-18 DIAGNOSIS — Z006 Encounter for examination for normal comparison and control in clinical research program: Secondary | ICD-10-CM

## 2020-06-18 NOTE — Research (Signed)
I called patient for 90-day CAD-Fem phone call. Patient was doing well and has no cardiac symptoms. I will call patient  In 9 months.

## 2020-08-22 DIAGNOSIS — M85851 Other specified disorders of bone density and structure, right thigh: Secondary | ICD-10-CM | POA: Diagnosis not present

## 2020-08-22 DIAGNOSIS — M85852 Other specified disorders of bone density and structure, left thigh: Secondary | ICD-10-CM | POA: Diagnosis not present

## 2020-08-22 DIAGNOSIS — Z780 Asymptomatic menopausal state: Secondary | ICD-10-CM | POA: Diagnosis not present

## 2020-08-22 LAB — HM DEXA SCAN

## 2020-08-31 ENCOUNTER — Telehealth: Payer: Self-pay

## 2020-08-31 ENCOUNTER — Encounter: Payer: Self-pay | Admitting: Physician Assistant

## 2020-08-31 ENCOUNTER — Ambulatory Visit (INDEPENDENT_AMBULATORY_CARE_PROVIDER_SITE_OTHER): Payer: Medicare Other | Admitting: Physician Assistant

## 2020-08-31 ENCOUNTER — Ambulatory Visit: Payer: Medicare Other | Admitting: Legal Medicine

## 2020-08-31 ENCOUNTER — Other Ambulatory Visit: Payer: Self-pay

## 2020-08-31 ENCOUNTER — Ambulatory Visit: Payer: Medicare Other | Admitting: Physician Assistant

## 2020-08-31 VITALS — BP 130/76 | HR 62 | Temp 97.3°F | Resp 16 | Ht 65.0 in | Wt 150.0 lb

## 2020-08-31 DIAGNOSIS — M5412 Radiculopathy, cervical region: Secondary | ICD-10-CM

## 2020-08-31 DIAGNOSIS — M5030 Other cervical disc degeneration, unspecified cervical region: Secondary | ICD-10-CM | POA: Diagnosis not present

## 2020-08-31 DIAGNOSIS — M4312 Spondylolisthesis, cervical region: Secondary | ICD-10-CM | POA: Diagnosis not present

## 2020-08-31 MED ORDER — PREDNISONE 20 MG PO TABS: ORAL_TABLET | ORAL | 0 refills | Status: AC

## 2020-08-31 NOTE — Telephone Encounter (Signed)
Pt calling with L arm pain. Pt states it hurts worse with certain movements. Denies fall or accident to cause pain. States it has been hurting since she received second covid shot. 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 Denies chest pain. States sometimes fingers turn white and can't feel them and at most lasts 10 minutes then normal color returns. States pain starts in neck and goes to hand. Pt has tried different medication medications w/ no help. Second covid shot was 06/29/2019. Pt has seen cardiology multiple times within last year. But nothing correlated states pt. Pt was also research subject but pain started beforehand.  Scheduled same day appointment.   Pt also questioning results of DEXA. Did not see in chart as of now.   Royce Macadamia, [place] 08/31/20 10:18 AM

## 2020-08-31 NOTE — Progress Notes (Signed)
Acute Office Visit  Subjective:    Patient ID: Monique Atkins, female    DOB: 07/15/1945, 75 y.o.   MRN: [id number]  Chief Complaint  Patient presents with  . Arm Pain    C/o pain that is progessively worsening. Has tried some asper cream and taking naproxen. Pain radiates down her arm mostly a dull throb, certain positions her arm is in will cause sharp pain. C/o of neck pain also.     HPI Patient is in today for arm and neck pain - she states she has had problems since 06/2019 - first started as arm/shoulder pain and progressively worsened - now having pains down her left arm and into neck Has noted weakness and tingling in left arm as well as pain No history of injury or trauma  Past Medical History:  Diagnosis Date  . Atypical ductal hyperplasia of breast 03/17/2015  . Atypical ductal hyperplasia, breast   . Glaucoma     Past Surgical History:  Procedure Laterality Date  . COLONOSCOPY    . RADIOACTIVE SEED GUIDED EXCISIONAL BREAST BIOPSY Left 03/23/2015   Procedure: RADIOACTIVE SEED GUIDED EXCISIONAL LEFT BREAST BIOPSY;  Surgeon: Rolm Bookbinder, MD;  Location: [facility];  Service: General;  Laterality: Left;  . WISDOM TOOTH EXTRACTION      Family History  Problem Relation Age of Onset  . Stroke Mother   . Osteoarthritis Mother   . Deep vein thrombosis Mother   . Neuropathy Mother   . Arthritis/Rheumatoid Sister     Social History   Socioeconomic History  . Marital status: Widowed    Spouse name: Not on file  . Number of children: Not on file  . Years of education: Not on file  . Highest education level: Not on file  Occupational History  . Not on file  Tobacco Use  . Smoking status: Never Smoker  . Smokeless tobacco: Never Used  Substance and Sexual Activity  . Alcohol use: Yes    Alcohol/week: 1.0 standard drink    Types: 1 Glasses of wine per week    Comment: occas wine  . Drug use: Never  . Sexual activity: Not on file   Other Topics Concern  . Not on file  Social History Narrative  . Not on file   Social Determinants of Health   Financial Resource Strain: Not on file  Food Insecurity: Not on file  Transportation Needs: Not on file  Physical Activity: Not on file  Stress: Not on file  Social Connections: Not on file  Intimate Partner Violence: Not on file    Outpatient Medications Prior to Visit  Medication Sig Dispense Refill  . latanoprost (XALATAN) 0.005 % ophthalmic solution     . timolol (TIMOPTIC) 0.5 % ophthalmic solution     . doxycycline (VIBRA-TABS) 100 MG tablet TAKE 1 TABLET BY MOUTH EVERY DAY 30 tablet 2  . metoprolol tartrate (LOPRESSOR) 100 MG tablet Take 1 tablet (100 mg total) by mouth as directed. Take 1 tablet 2 hours before CT 1 tablet 0   No facility-administered medications prior to visit.    No Known Allergies  Review of Systems CONSTITUTIONAL: Negative for chills, fatigue, fever, unintentional weight gain and unintentional weight loss.  CARDIOVASCULAR: Negative for chest pain, dizziness, palpitations and pedal edema.  RESPIRATORY: Negative for recent cough and dyspnea.  MSK: see HPI INTEGUMENTARY: Negative for rash.         Objective:    Physical Exam PHYSICAL EXAM:  VS: BP 130/76   Pulse 62   Temp (!) 97.3 F (36.3 C)   Resp 16   Ht 5\' 5"  (1.651 m)   Wt 150 lb (68 kg)   BMI 24.96 kg/m   GEN: Well nourished, well developed, in no acute distress  Cardiac: RRR; no murmurs, rubs, or gallops,no edema -  Respiratory:  normal respiratory rate and pattern with no distress - normal breath sounds with no rales, rhonchi, wheezes or rubs MS: no deformity or atrophy however weakness is noted with rom  Skin: warm and dry, no rash   BP 130/76   Pulse 62   Temp (!) 97.3 F (36.3 C)   Resp 16   Ht 5\' 5"  (1.651 m)   Wt 150 lb (68 kg)   BMI 24.96 kg/m  Wt Readings from Last 3 Encounters:  08/31/20 150 lb (68 kg)  04/14/20 146 lb 9.6 oz (66.5 kg)  02/20/20  145 lb 6.4 oz (66 kg)    Health Maintenance Due  Topic Date Due  . Hepatitis C Screening  Never done  . TETANUS/TDAP  Never done    There are no preventive care reminders to display for this patient.   Lab Results  Component Value Date   TSH 2.37 08/10/2006   Lab Results  Component Value Date   WBC 6.0 08/10/2006   HGB 14.3 08/10/2006   HCT 41.4 08/10/2006   MCV 87.1 08/10/2006   PLT 269 08/10/2006   Lab Results  Component Value Date   NA 141 03/09/2020   K 4.1 03/09/2020   CO2 27 03/09/2020   GLUCOSE 83 03/09/2020   BUN 14 03/09/2020   CREATININE 0.84 03/09/2020   BILITOT 0.8 08/10/2006   ALKPHOS 75 08/10/2006   AST 21 08/10/2006   ALT 22 08/10/2006   PROT 7.3 08/10/2006   ALBUMIN 4.1 08/10/2006   CALCIUM 10.0 03/09/2020   Lab Results  Component Value Date   CHOL 186 08/10/2006   Lab Results  Component Value Date   HDL 114.0 08/10/2006   Lab Results  Component Value Date   LDLCALC 54 08/10/2006   Lab Results  Component Value Date   TRIG 91 08/10/2006   Lab Results  Component Value Date   CHOLHDL 1.6 CALC 08/10/2006   No results found for: HGBA1C     Assessment & Plan:  1. Cervical radiculopathy - DG Cervical Spine Complete - predniSONE (DELTASONE) 20 MG tablet; Take 3 tablets (60 mg total) by mouth daily with breakfast for 3 days, THEN 2 tablets (40 mg total) daily with breakfast for 3 days, THEN 1 tablet (20 mg total) daily with breakfast for 3 days.  Dispense: 18 tablet; Refill: 0    Meds ordered this encounter  Medications  . predniSONE (DELTASONE) 20 MG tablet    Sig: Take 3 tablets (60 mg total) by mouth daily with breakfast for 3 days, THEN 2 tablets (40 mg total) daily with breakfast for 3 days, THEN 1 tablet (20 mg total) daily with breakfast for 3 days.    Dispense:  18 tablet    Refill:  0    Order Specific Question:   Supervising Provider    AnswerShelton Silvas    Orders Placed This Encounter  Procedures  . DG  Cervical Spine Complete    Will plan to order cervical MRI  Follow-up: No follow-ups on file.  An After Visit Summary was printed and given to the patient.  SARA R  [facility] [phone]

## 2020-09-03 ENCOUNTER — Telehealth: Payer: Self-pay

## 2020-09-03 NOTE — Telephone Encounter (Signed)
Called pt w/ cervical spine results. Findings: Multilevel degenerative disc and facet disease.   Pt states she would like to learn more about what this is. Also if there is a way to help disease then she will proceed with MRI. If not, she does not know if she will proceed. Please advise.  Monique Atkins, [place] 09/03/20 4:05 PM

## 2020-09-03 NOTE — Telephone Encounter (Signed)
Called pt. Pt VU. She will wait on MRI.   Harrell Lark 09/03/20 4:38 PM

## 2020-09-03 NOTE — Telephone Encounter (Signed)
This is arthritis changes - the numbness down her arm is probable due to impinged disc like we spoke about at office She can try NSAIDs but if symptoms persist recommend MRI

## 2020-11-10 DIAGNOSIS — H401112 Primary open-angle glaucoma, right eye, moderate stage: Secondary | ICD-10-CM | POA: Diagnosis not present

## 2020-11-10 DIAGNOSIS — H401122 Primary open-angle glaucoma, left eye, moderate stage: Secondary | ICD-10-CM | POA: Diagnosis not present

## 2020-11-26 NOTE — Progress Notes (Signed)
Acute Office Visit  Subjective:    Patient ID: Monique Atkins, female    DOB: Jun 08, 1945, 75 y.o.   MRN: [id number]  Chief Complaint  Patient presents with   swollen lymph node    Redness on inside of mouth    HPI Patient is in today for left jaw pain, tongue lesion, and left neck lymph node edema. Onset of symptoms was approximately 10 days ago. Treatment has included warm salt water gargles, Anbesol, and changes to diet. Pt has avoided acidic food, nuts, seeds, and salt. She tells me she has a loose crown to left lower molar. Sees a dentist regularly. Denies recent coughs, colds, or exposure to known ill-contacts.   Past Medical History:  Diagnosis Date   Atypical ductal hyperplasia of breast 03/17/2015   Atypical ductal hyperplasia, breast    Glaucoma     Past Surgical History:  Procedure Laterality Date   COLONOSCOPY     RADIOACTIVE SEED GUIDED EXCISIONAL BREAST BIOPSY Left 03/23/2015   Procedure: RADIOACTIVE SEED GUIDED EXCISIONAL LEFT BREAST BIOPSY;  Surgeon: Rolm Bookbinder, MD;  Location: [facility];  Service: General;  Laterality: Left;   WISDOM TOOTH EXTRACTION      Family History  Problem Relation Age of Onset   Stroke Mother    Osteoarthritis Mother    Deep vein thrombosis Mother    Neuropathy Mother    Arthritis/Rheumatoid Sister     Social History   Socioeconomic History   Marital status: Widowed    Spouse name: Not on file   Number of children: Not on file   Years of education: Not on file   Highest education level: Not on file  Occupational History   Not on file  Tobacco Use   Smoking status: Never   Smokeless tobacco: Never  Substance and Sexual Activity   Alcohol use: Yes    Alcohol/week: 1.0 standard drink    Types: 1 Glasses of wine per week    Comment: occas wine   Drug use: Never   Sexual activity: Not on file  Other Topics Concern   Not on file  Social History Narrative   Not on file   Social  Determinants of Health   Financial Resource Strain: Not on file  Food Insecurity: Not on file  Transportation Needs: Not on file  Physical Activity: Not on file  Stress: Not on file  Social Connections: Not on file  Intimate Partner Violence: Not on file    Outpatient Medications Prior to Visit  Medication Sig Dispense Refill   latanoprost (XALATAN) 0.005 % ophthalmic solution      timolol (TIMOPTIC) 0.5 % ophthalmic solution      No facility-administered medications prior to visit.    No Known Allergies  Review of Systems  Constitutional:  Negative for appetite change, fatigue and fever.  HENT:  Positive for mouth sores (with swelled adjacent lymph node below left side of jaw line.). Negative for congestion, ear pain, sinus pressure and sore throat.   Eyes:  Negative for pain.  Respiratory:  Positive for cough. Negative for chest tightness, shortness of breath and wheezing.   Cardiovascular:  Negative for chest pain and palpitations.  Gastrointestinal:  Negative for abdominal pain, constipation, diarrhea, nausea and vomiting.  Genitourinary:  Negative for dysuria and hematuria.  Musculoskeletal:  Negative for arthralgias, back pain, joint swelling and myalgias.  Skin:  Negative for rash.  Neurological:  Negative for dizziness, weakness and headaches.  Hematological:  Positive for  adenopathy (left neck).  Psychiatric/Behavioral:  Negative for dysphoric mood. The patient is not nervous/anxious.       Objective:    Physical Exam Vitals reviewed.  Constitutional:      Appearance: Normal appearance.  HENT:     Mouth/Throat:     Mouth: Mucous membranes are moist.     Tongue: Lesions present.     Pharynx: Posterior oropharyngeal erythema present.     Comments: Erythema, edema, and tongue lesion noted under left side of tongue Cardiovascular:     Rate and Rhythm: Normal rate and regular rhythm.     Pulses: Normal pulses.     Heart sounds: Normal heart sounds.  Pulmonary:      Effort: Pulmonary effort is normal.     Breath sounds: Normal breath sounds.  Abdominal:     General: Bowel sounds are normal.     Palpations: Abdomen is soft.  Musculoskeletal:        General: Normal range of motion.  Lymphadenopathy:     Cervical: Cervical adenopathy (left tonsillar lymph node) present.  Skin:    General: Skin is warm and dry.     Capillary Refill: Capillary refill takes less than 2 seconds.  Neurological:     General: No focal deficit present.     Mental Status: She is alert and oriented to person, place, and time.  Psychiatric:        Mood and Affect: Mood normal.        Behavior: Behavior normal.    BP 132/78 (BP Location: Left Arm, Patient Position: Sitting)   Pulse 61   Temp 97.8 F (36.6 C) (Temporal)   Ht 5\' 5"  (1.651 m)   Wt 147 lb (66.7 kg)   SpO2 98%   BMI 24.46 kg/m  Wt Readings from Last 3 Encounters:  11/27/20 147 lb (66.7 kg)  08/31/20 150 lb (68 kg)  04/14/20 146 lb 9.6 oz (66.5 kg)    Health Maintenance Due  Topic Date Due   Hepatitis C Screening  Never done   TETANUS/TDAP  Never done   Zoster Vaccines- Shingrix (1 of 2) Never done   COVID-19 Vaccine (4 - Booster for Pfizer series) 06/21/2020     Lab Results  Component Value Date   TSH 2.37 08/10/2006   Lab Results  Component Value Date   WBC 6.0 08/10/2006   HGB 14.3 08/10/2006   HCT 41.4 08/10/2006   MCV 87.1 08/10/2006   PLT 269 08/10/2006   Lab Results  Component Value Date   NA 141 03/09/2020   K 4.1 03/09/2020   CO2 27 03/09/2020   GLUCOSE 83 03/09/2020   BUN 14 03/09/2020   CREATININE 0.84 03/09/2020   BILITOT 0.8 08/10/2006   ALKPHOS 75 08/10/2006   AST 21 08/10/2006   ALT 22 08/10/2006   PROT 7.3 08/10/2006   ALBUMIN 4.1 08/10/2006   CALCIUM 10.0 03/09/2020   Lab Results  Component Value Date   CHOL 186 08/10/2006   Lab Results  Component Value Date   HDL 114.0 08/10/2006   Lab Results  Component Value Date   LDLCALC 54 08/10/2006    Lab Results  Component Value Date   TRIG 91 08/10/2006   Lab Results  Component Value Date   CHOLHDL 1.6 CALC 08/10/2006          Assessment & Plan:   1. Jaw pain, non-TMJ - CBC with Differential/Platelet - Comprehensive metabolic panel - P59 and Folate Panel - penicillin v  potassium (VEETID) 500 MG tablet; Take 1 tablet (500 mg total) by mouth 3 (three) times daily for 5 days.  Dispense: 15 tablet; Refill: 0 - magic mouthwash w/lidocaine SOLN; Take 5 mLs by mouth 4 (four) times daily as needed for mouth pain.  Dispense: 120 mL; Refill: 0  2. Burning tongue - CBC with Differential/Platelet - Comprehensive metabolic panel - A19 and Folate Panel - penicillin v potassium (VEETID) 500 MG tablet; Take 1 tablet (500 mg total) by mouth 3 (three) times daily for 5 days.  Dispense: 15 tablet; Refill: 0 - magic mouthwash w/lidocaine SOLN; Take 5 mLs by mouth 4 (four) times daily as needed for mouth pain.  Dispense: 120 mL; Refill: 0  3. Oral lesion - penicillin v potassium (VEETID) 500 MG tablet; Take 1 tablet (500 mg total) by mouth 3 (three) times daily for 5 days.  Dispense: 15 tablet; Refill: 0 - magic mouthwash w/lidocaine SOLN; Take 5 mLs by mouth 4 (four) times daily as needed for mouth pain.  Dispense: 120 mL; Refill: 0   Use Magic Mouthwash four times daily as needed for mouth pain Take antibiotic three times daily (with food) for 5 days Take Ibuprofen as directed for pain We will call you with lab results Follow-up with dentist next week   Follow-up: As needed  I,Lauren M Auman,acting as a scribe for [employer], NP.,have documented all relevant documentation on the behalf of Rip Harbour, NP,as directed by  Rip Harbour, NP while in the presence of Rip Harbour, NP.   I, Rip Harbour, NP, have reviewed all documentation for this visit. The documentation on 11/27/20 for the exam, diagnosis, procedures, and orders are all accurate and complete.     Signed, Jerrell Belfast, DNP

## 2020-11-27 ENCOUNTER — Other Ambulatory Visit: Payer: Self-pay

## 2020-11-27 ENCOUNTER — Ambulatory Visit (INDEPENDENT_AMBULATORY_CARE_PROVIDER_SITE_OTHER): Payer: Medicare Other | Admitting: Nurse Practitioner

## 2020-11-27 ENCOUNTER — Encounter: Payer: Self-pay | Admitting: Nurse Practitioner

## 2020-11-27 VITALS — BP 132/78 | HR 61 | Temp 97.8°F | Ht 65.0 in | Wt 147.0 lb

## 2020-11-27 DIAGNOSIS — K1370 Unspecified lesions of oral mucosa: Secondary | ICD-10-CM | POA: Diagnosis not present

## 2020-11-27 DIAGNOSIS — R6884 Jaw pain: Secondary | ICD-10-CM | POA: Diagnosis not present

## 2020-11-27 DIAGNOSIS — K146 Glossodynia: Secondary | ICD-10-CM | POA: Diagnosis not present

## 2020-11-27 MED ORDER — MAGIC MOUTHWASH W/LIDOCAINE: 5.0000 mL | Freq: Four times a day (QID) | ORAL | 0 refills | Status: DC | PRN

## 2020-11-27 MED ORDER — PENICILLIN V POTASSIUM 500 MG PO TABS: 500.0000 mg | ORAL_TABLET | Freq: Three times a day (TID) | ORAL | 0 refills | Status: AC

## 2020-11-27 NOTE — Patient Instructions (Addendum)
Use Magic Mouthwash four times daily as needed for mouth pain Take antibiotic three times daily (with food) for 5 days Take Ibuprofen as directed for pain We will call you with lab results Follow-up with dentist next week  Glossitis Glossitis is inflammation of the tongue. This may be a stand-alone condition, or it may be a symptom of a different condition that you have. Generally, glossitis goes away when its cause is identified and treated. Glossitis can bedangerous if it causes difficulty breathing. What are the causes? This condition may be caused by many different things. Common causes include: Viral, bacterial, or yeast infections. Allergies. Disorders that affect the skin and mucous membranes, such as certain autoimmune disorders. Abnormal tissue growths (tumors). Lack of healthy red blood cells (anemia). Movement of stomach acid into the muscular tube that connects the mouth and the stomach (gastroesophageal reflux). Lack of proper nutrition or certain vitamins. Certain lifelong (chronic) medical conditions, such as diabetes. Sometimes, glossitis may not be caused by an underlying condition. In these cases, glossitis may be caused by: Use of tobacco products, such as cigarettes, chewing tobacco, or e-cigarettes. Excessive alcohol use. Tongue injury or irritation. Certain medicines, such as medicines to treat cancer. In some cases, the cause is not known. What increases the risk? The following factors may make you more likely to develop this condition: Being a man. Taking antibiotics or steroids, such as asthma medicines. Drinking alcohol excessively. Using tobacco products, such as cigarettes, chewing tobacco, or e-cigarettes. Having a chronic medical condition, such as an immune disease or cancer. Not brushing or flossing your teeth regularly. Being anemic or not getting proper nutrition. What are the signs or symptoms? Symptoms of this condition vary depending on the  cause. Symptoms may include: Swelling of the tongue. Pain and tenderness in the tongue. Sometimes, this condition is painless. Changes in tongue color. The tongue may be pale or bright red. Smooth areas on the tongue's surface. A small mass of tissue (node) or white patch on the tongue. Difficulty chewing, swallowing, or talking. Difficulty breathing. How is this diagnosed? This condition is diagnosed based on a physical exam and medical history. Your health care provider may ask you about your eating and drinking habits. You may also have tests, including: Blood tests. Removal of a small amount of cells from the tongue that are examined under a microscope (biopsy). You may be given the name of a dentist or a health care provider who specializes in ear, nose, and throat (ENT) problems (otolaryngologist). How is this treated? Treatment for this condition depends on the cause and may include: Following instructions from your health care provider about keeping your mouth clean and avoiding irritants that may have caused your condition or made it worse. Nutritional therapy. Your health care provider may tell you to change your eating and drinking habits or take a nutritional supplement. Managing underlying conditions that may have caused your glossitis. Medicines, such as: Corticosteroids to reduce inflammation. Antibiotics if your condition was caused by an infection. Medicines that numb your tongue or mouth (local anesthetics). Follow these instructions at home: Eating and drinking  Eat healthy foods. Follow instructions from your health care provider about eating or drinking restrictions. If you drink alcohol, limit how much you have: 0-1 drink a day for women. 0-2 drinks a day for men. Be aware of how much alcohol is in your drink. In the [place] one drink equals one 12 oz bottle of beer (355 mL), one 5 oz glass of wine (  148 mL), or one 1 oz glass of hard liquor (44 mL).  Mouth  care  Keep your teeth and mouth clean. This includes brushing and flossing frequently and having regular dental checkups. If you wear dentures or dental braces, work with your dentist to make sure they fit correctly. Follow other instructions from your health care provider about how to take care of your mouth. He or she may recommend that you: Gently brush your tongue. Gargle with a salt-water mixture 3-4 times a day or as needed. To make a salt-water mixture, completely dissolve -1 tsp of salt in 1 cup of warm water. Avoid any irritants that may have caused your condition or made it worse, such as chemicals or certain foods. Avoid breath mints, antibacterial mouthwash, and chewing gum.  General instructions  Do not use any products that contain nicotine or tobacco, such as cigarettes and e-cigarettes. If you need help quitting, ask your health care provider. Take over-the-counter and prescription medicines only as told by your health care provider. Take supplements only as told by your health care provider. Follow the directions carefully. Keep all follow-up visits as told by your health care provider. This is important.  Contact a health care provider if: You have a fever. You develop new symptoms. You have symptoms that do not get better with medicine or get worse. You have symptoms that do not go away after 10 days. You cannot eat or drink because of your pain. Get help right away if: You have severe pain or swelling. You have difficulty breathing, swallowing, or talking. Summary Glossitis is inflammation of the tongue. It can be caused by many different things. Glossitis generally goes away when its cause is identified and treated. Keep your teeth and mouth clean. This includes brushing and flossing frequently and having regular dental checkups. Eat healthy foods. Follow instructions from your health care provider about eating or drinking restrictions. This information is not  intended to replace advice given to you by your health care provider. Make sure you discuss any questions you have with your healthcare provider. Document Revised: 09/04/2017 Document Reviewed: 09/04/2017 Elsevier Patient Education  [place].

## 2020-11-28 LAB — COMPREHENSIVE METABOLIC PANEL
ALT: 14 IU/L (ref 0–32)
AST: 14 IU/L (ref 0–40)
Albumin/Globulin Ratio: 2.1 (ref 1.2–2.2)
Albumin: 4.5 g/dL (ref 3.7–4.7)
Alkaline Phosphatase: 64 IU/L (ref 44–121)
BUN/Creatinine Ratio: 12 (ref 12–28)
BUN: 11 mg/dL (ref 8–27)
Bilirubin Total: 0.4 mg/dL (ref 0.0–1.2)
CO2: 25 mmol/L (ref 20–29)
Calcium: 10 mg/dL (ref 8.7–10.3)
Chloride: 101 mmol/L (ref 96–106)
Creatinine, Ser: 0.93 mg/dL (ref 0.57–1.00)
Globulin, Total: 2.1 g/dL (ref 1.5–4.5)
Glucose: 94 mg/dL (ref 65–99)
Potassium: 4.8 mmol/L (ref 3.5–5.2)
Sodium: 140 mmol/L (ref 134–144)
Total Protein: 6.6 g/dL (ref 6.0–8.5)
eGFR: 65 mL/min/{1.73_m2} (ref >59)

## 2020-11-28 LAB — CBC WITH DIFFERENTIAL/PLATELET
Basophils Absolute: 0 10*3/uL (ref 0.0–0.2)
Basos: 1 %
EOS (ABSOLUTE): 0.3 10*3/uL (ref 0.0–0.4)
Eos: 4 %
Hematocrit: 40.1 % (ref 34.0–46.6)
Hemoglobin: 13.9 g/dL (ref 11.1–15.9)
Immature Grans (Abs): 0 10*3/uL (ref 0.0–0.1)
Immature Granulocytes: 0 %
Lymphocytes Absolute: 1.7 10*3/uL (ref 0.7–3.1)
Lymphs: 28 %
MCH: 29.8 pg (ref 26.6–33.0)
MCHC: 34.7 g/dL (ref 31.5–35.7)
MCV: 86 fL (ref 79–97)
Monocytes Absolute: 0.6 10*3/uL (ref 0.1–0.9)
Monocytes: 10 %
Neutrophils Absolute: 3.6 10*3/uL (ref 1.4–7.0)
Neutrophils: 57 %
Platelets: 261 10*3/uL (ref 150–450)
RBC: 4.66 x10E6/uL (ref 3.77–5.28)
RDW: 12.4 % (ref 11.7–15.4)
WBC: 6.3 10*3/uL (ref 3.4–10.8)

## 2020-11-28 LAB — B12 AND FOLATE PANEL
Folate: >20 ng/mL (ref >3.0)
Vitamin B-12: 983 pg/mL (ref 232–1245)

## 2021-04-24 DIAGNOSIS — Z23 Encounter for immunization: Secondary | ICD-10-CM | POA: Diagnosis not present

## 2021-05-01 DIAGNOSIS — Z23 Encounter for immunization: Secondary | ICD-10-CM | POA: Diagnosis not present

## 2021-05-03 DIAGNOSIS — L821 Other seborrheic keratosis: Secondary | ICD-10-CM | POA: Diagnosis not present

## 2021-05-03 DIAGNOSIS — D1801 Hemangioma of skin and subcutaneous tissue: Secondary | ICD-10-CM | POA: Diagnosis not present

## 2021-05-03 DIAGNOSIS — L700 Acne vulgaris: Secondary | ICD-10-CM | POA: Diagnosis not present

## 2021-05-03 DIAGNOSIS — L818 Other specified disorders of pigmentation: Secondary | ICD-10-CM | POA: Diagnosis not present

## 2021-05-03 DIAGNOSIS — L814 Other melanin hyperpigmentation: Secondary | ICD-10-CM | POA: Diagnosis not present

## 2021-05-03 DIAGNOSIS — D229 Melanocytic nevi, unspecified: Secondary | ICD-10-CM | POA: Diagnosis not present

## 2021-05-03 DIAGNOSIS — L578 Other skin changes due to chronic exposure to nonionizing radiation: Secondary | ICD-10-CM | POA: Diagnosis not present

## 2021-05-03 DIAGNOSIS — Z23 Encounter for immunization: Secondary | ICD-10-CM | POA: Diagnosis not present

## 2021-05-03 DIAGNOSIS — L570 Actinic keratosis: Secondary | ICD-10-CM | POA: Diagnosis not present

## 2021-05-12 DIAGNOSIS — H401122 Primary open-angle glaucoma, left eye, moderate stage: Secondary | ICD-10-CM | POA: Diagnosis not present

## 2021-05-12 DIAGNOSIS — H52203 Unspecified astigmatism, bilateral: Secondary | ICD-10-CM | POA: Diagnosis not present

## 2021-05-12 DIAGNOSIS — H401112 Primary open-angle glaucoma, right eye, moderate stage: Secondary | ICD-10-CM | POA: Diagnosis not present

## 2021-05-12 DIAGNOSIS — Z961 Presence of intraocular lens: Secondary | ICD-10-CM | POA: Diagnosis not present

## 2021-05-28 ENCOUNTER — Other Ambulatory Visit: Payer: Self-pay

## 2021-05-28 ENCOUNTER — Ambulatory Visit (INDEPENDENT_AMBULATORY_CARE_PROVIDER_SITE_OTHER): Payer: Medicare Other

## 2021-05-28 VITALS — BP 128/80 | HR 66 | Resp 16 | Ht 65.0 in | Wt 149.4 lb

## 2021-05-28 DIAGNOSIS — L818 Other specified disorders of pigmentation: Secondary | ICD-10-CM | POA: Insufficient documentation

## 2021-05-28 DIAGNOSIS — Z0000 Encounter for general adult medical examination without abnormal findings: Secondary | ICD-10-CM

## 2021-05-28 DIAGNOSIS — L821 Other seborrheic keratosis: Secondary | ICD-10-CM | POA: Insufficient documentation

## 2021-05-28 NOTE — Patient Instructions (Signed)

## 2021-05-28 NOTE — Progress Notes (Signed)
Subjective:   Monique Atkins is a 76 y.o. female who presents for Medicare Annual (Subsequent) preventive examination.  This wellness visit is conducted by a nurse.  The patient's medications were reviewed and reconciled since the patient's last visit.  History details were provided by the patient.  The history appears to be reliable.    Patient's last AWV was one year ago.   Medical History: Patient history and Family history was reviewed  Medications, Allergies, and preventative health maintenance was reviewed and updated.   Review of Systems    ROS-Negative Cardiac Risk Factors include: advanced age (>4men, >88 women)     Objective:    Today's Vitals   05/28/21 1021  BP: 128/80  Pulse: 66  Resp: 16  Weight: 149 lb 6.4 oz (67.8 kg)  Height: 5\' 5"  (1.651 m)  PainSc: 0-No pain   Body mass index is 24.86 kg/m.  Advanced Directives 05/28/2021 04/14/2020 03/23/2015 03/16/2015  Does Patient Have a Medical Advance Directive? Yes No Yes Yes  Type of Advance Directive - - - Living will    Current Medications (verified) Outpatient Encounter Medications as of 05/28/2021  Medication Sig   latanoprost (XALATAN) 0.005 % ophthalmic solution    timolol (TIMOPTIC) 0.25 % ophthalmic solution 1 drop into affected eye   tretinoin (RETIN-A) 3.500 % cream 1 application in the evening to face   [DISCONTINUED] magic mouthwash w/lidocaine SOLN Take 5 mLs by mouth 4 (four) times daily as needed for mouth pain.   [DISCONTINUED] timolol (TIMOPTIC) 0.5 % ophthalmic solution    No facility-administered encounter medications on file as of 05/28/2021.    Allergies (verified) Patient has no known allergies.   History: Past Medical History:  Diagnosis Date   Atypical ductal hyperplasia of breast 03/17/2015   Atypical ductal hyperplasia, breast    Glaucoma    Past Surgical History:  Procedure Laterality Date   COLONOSCOPY  04/05/2016   RADIOACTIVE SEED GUIDED EXCISIONAL BREAST BIOPSY  Left 03/23/2015   Procedure: RADIOACTIVE SEED GUIDED EXCISIONAL LEFT BREAST BIOPSY;  Surgeon: Rolm Bookbinder, MD;  Location: [facility];  Service: General;  Laterality: Left;   WISDOM TOOTH EXTRACTION     Family History  Problem Relation Age of Onset   Stroke Mother    Osteoarthritis Mother    Deep vein thrombosis Mother    Neuropathy Mother    Arthritis/Rheumatoid Sister    Social History   Socioeconomic History   Marital status: Widowed   Number of children: 1  Occupational History   Occupation: [employer]  Tobacco Use   Smoking status: Never   Smokeless tobacco: Never  Vaping Use   Vaping Use: Never used  Substance and Sexual Activity   Alcohol use: Yes    Alcohol/week: 1.0 standard drink    Types: 1 Glasses of wine per week    Comment: occas wine   Drug use: Never   Sexual activity: Not on file   Social Determinants of Health   Financial Resource Strain: Not on file  Food Insecurity: No Food Insecurity   Worried About Charity fundraiser in the Last Year: Never true   Ran Out of Food in the Last Year: Never true  Transportation Needs: No Transportation Needs   Lack of Transportation (Medical): No   Lack of Transportation (Non-Medical): No  Physical Activity: Sufficiently Active   Days of Exercise per Week: 6 days   Minutes of Exercise per Session: 90 min  Stress: Not on  file  Social Connections: Not on file    Tobacco Counseling Counseling given: No tobacco products used   Clinical Intake:  Pre-visit preparation completed: Yes Pain : No/denies pain Pain Score: 0-No pain   BMI - recorded: 24.86 Nutritional Status: BMI of 19-24  Normal Nutritional Risks: None Diabetes: No How often do you need to have someone help you when you read instructions, pamphlets, or other written materials from your doctor or pharmacy?: 1 - Never Interpreter Needed?: No   Activities of Daily Living In your present state of health, do you have any  difficulty performing the following activities: 05/28/2021  Hearing? N  Vision? N  Difficulty concentrating or making decisions? N  Walking or climbing stairs? N  Dressing or bathing? N  Doing errands, shopping? N  Preparing Food and eating ? N  Using the Toilet? N  In the past six months, have you accidently leaked urine? N  Do you have problems with loss of bowel control? N  Managing your Medications? N  Managing your Finances? N  Housekeeping or managing your Housekeeping? N  Some recent data might be hidden    Patient Care Team: Rochel Brome, MD as PCP - General (Family Medicine) Mammography, [facility] (Diagnostic Radiology) Luberta Mutter, MD as Consulting Physician (Ophthalmology)     Assessment:   This is a routine wellness examination for [place].  Hearing/Vision screen No results found.  Dietary issues and exercise activities discussed: Current Exercise Habits: Home exercise routine, Type of exercise: strength training/weights;stretching;walking, Time (Minutes): 60, Frequency (Times/Week): 6, Weekly Exercise (Minutes/Week): 360, Intensity: Intense, Exercise limited by: None identified  Depression Screen PHQ 2/9 Scores 05/28/2021 04/14/2020 02/14/2020  PHQ - 2 Score 0 0 0    Fall Risk Fall Risk  05/28/2021 08/31/2020 04/14/2020 02/14/2020 12/20/2018  Falls in the past year? 0 0 0 0 (No Data)  Comment - - - - [facility] Telephone Survey: data to providers prior to load  Number falls in past yr: 0 0 0 0 (No Data)  Comment - - - - [facility]i Telephone Survey Actual Response =   Injury with Fall? 0 0 0 0 -  Risk for fall due to : No Fall Risks No Fall Risks No Fall Risks - -  Follow up Falls evaluation completed Falls evaluation completed - - -    FALL RISK PREVENTION PERTAINING TO THE HOME:  Home free of loose throw rugs in walkways, pet beds, electrical cords, etc? Yes  Adequate lighting in your home to reduce risk of falls? Yes   ASSISTIVE DEVICES UTILIZED TO PREVENT FALLS:  Life  alert? No  Use of a cane, walker or w/c? No  Gait steady and fast without use of assistive device  Cognitive Function:     6CIT Screen 05/28/2021 04/14/2020  What Year? 0 points 0 points  What month? 0 points 0 points  What time? 0 points 0 points  Count back from 20 0 points 0 points  Months in reverse 0 points 0 points  Repeat phrase 0 points 0 points  Total Score 0 0    Immunizations Immunization History  Administered Date(s) Administered   Fluad Quad(high Dose 65+) 01/23/2020   PFIZER(Purple Top)SARS-COV-2 Vaccination 06/09/2019, 06/29/2019, 02/20/2020   Pneumococcal Conjugate-13 04/05/2016   Pneumococcal Polysaccharide-23 11/14/2018    Flu Vaccine status: Up to date  Pneumococcal vaccine status: Up to date  Covid-19 vaccine status: Completed vaccines  Qualifies for Shingles Vaccine? Yes   Zostavax completed No   Shingrix Completed?: No.  Education has been provided regarding the importance of this vaccine. Patient has been advised to call insurance company to determine out of pocket expense if they have not yet received this vaccine. Advised may also receive vaccine at local pharmacy or Health Dept. Verbalized acceptance and understanding.  Screening Tests Health Maintenance  Topic Date Due   Hepatitis C Screening  Never done   TETANUS/TDAP  Never done   Zoster Vaccines- Shingrix (1 of 2) Never done   COVID-19 Vaccine (4 - Booster for Pfizer series) 04/16/2020   MAMMOGRAM  05/29/2022   DEXA SCAN  08/23/2022   COLONOSCOPY (Pts 45-61yrs Insurance coverage will need to be confirmed)  04/05/2026   Pneumonia Vaccine 23+ Years old  Completed   INFLUENZA VACCINE  Completed   [facility] Maintenance Due  Topic Date Due   Hepatitis C Screening  Never done   TETANUS/TDAP  Never done   Zoster Vaccines- Shingrix (1 of 2) Never done   COVID-19 Vaccine (4 - Booster for Pfizer series) 04/16/2020    Colorectal cancer  screening: Type of screening: Colonoscopy. Completed 04/05/2016. Repeat every 10 years  Mammogram status: Completed 05/2020. Repeat every year  Bone Density status: Completed 08/2020.   Lung Cancer Screening: (Low Dose CT Chest recommended if Age 74-80 years, 30 pack-year currently smoking OR have quit w/in 15years.) does not qualify.   Additional Screening:  Vision Screening: Recommended annual ophthalmology exams for early detection of glaucoma and other disorders of the eye. Is the patient up to date with their annual eye exam?  Yes  Who is the provider or what is the name of the office in which the patient attends annual eye exams? [facility]  Dental Screening: Recommended annual dental exams for proper oral hygiene    Plan:    1- Shingrix Vaccine Recommended 2- Tetanus Vaccine - I could not find records of the last tetanus vaccine, you can get this at the pharmacy.  Recommended every 10 years. 3- Mammogram - already scheduled at [facility] Directive - make updates as needed and bring a copy for our records  I have personally reviewed and noted the following in the patients chart:   Medical and social history Use of alcohol, tobacco or illicit drugs  Current medications and supplements including opioid prescriptions.  Functional ability and status Nutritional status Physical activity Advanced directives List of other physicians Hospitalizations, surgeries, and ER visits in previous 12 months Vitals Screenings to include cognitive, depression, and falls Referrals and appointments  In addition, I have reviewed and discussed with patient certain preventive protocols, quality metrics, and best practice recommendations. A written personalized care plan for preventive services as well as general preventive health recommendations were provided to patient.     Erie Noe, LPN   09/28/5275

## 2021-06-04 DIAGNOSIS — Z1231 Encounter for screening mammogram for malignant neoplasm of breast: Secondary | ICD-10-CM | POA: Diagnosis not present

## 2021-06-04 LAB — HM MAMMOGRAPHY

## 2021-06-23 DIAGNOSIS — L570 Actinic keratosis: Secondary | ICD-10-CM | POA: Diagnosis not present

## 2021-06-23 DIAGNOSIS — Z23 Encounter for immunization: Secondary | ICD-10-CM | POA: Diagnosis not present

## 2021-07-12 ENCOUNTER — Encounter: Payer: Self-pay | Admitting: Family Medicine

## 2021-11-16 DIAGNOSIS — H401112 Primary open-angle glaucoma, right eye, moderate stage: Secondary | ICD-10-CM | POA: Diagnosis not present

## 2021-11-16 DIAGNOSIS — H52203 Unspecified astigmatism, bilateral: Secondary | ICD-10-CM | POA: Diagnosis not present

## 2021-12-08 DIAGNOSIS — H401112 Primary open-angle glaucoma, right eye, moderate stage: Secondary | ICD-10-CM | POA: Diagnosis not present

## 2022-01-27 ENCOUNTER — Encounter: Payer: Self-pay | Admitting: Nurse Practitioner

## 2022-01-27 ENCOUNTER — Ambulatory Visit (INDEPENDENT_AMBULATORY_CARE_PROVIDER_SITE_OTHER): Payer: Medicare Other | Admitting: Nurse Practitioner

## 2022-01-27 VITALS — BP 110/72 | HR 70 | Temp 99.0°F | Ht 65.0 in | Wt 141.0 lb

## 2022-01-27 DIAGNOSIS — N3000 Acute cystitis without hematuria: Secondary | ICD-10-CM | POA: Diagnosis not present

## 2022-01-27 DIAGNOSIS — R350 Frequency of micturition: Secondary | ICD-10-CM | POA: Diagnosis not present

## 2022-01-27 LAB — POCT URINALYSIS DIPSTICK
Bilirubin, UA: NEGATIVE
Blood, UA: NEGATIVE
Glucose, UA: NEGATIVE
Ketones, UA: NEGATIVE
Leukocytes, UA: NEGATIVE
Nitrite, UA: NEGATIVE
Protein, UA: NEGATIVE
Spec Grav, UA: 1.01 (ref 1.010–1.025)
Urobilinogen, UA: NEGATIVE E.U./dL — AB
pH, UA: 6.5 (ref 5.0–8.0)

## 2022-01-27 MED ORDER — NITROFURANTOIN MONOHYD MACRO 100 MG PO CAPS: 100.0000 mg | ORAL_CAPSULE | Freq: Two times a day (BID) | ORAL | 0 refills | Status: DC

## 2022-01-27 NOTE — Progress Notes (Deleted)
Acute Office Visit  Subjective:    Patient ID: Monique Atkins, female    DOB: 04-Apr-1946, 76 y.o.   MRN: [id number]  Chief Complaint  Patient presents with   Urinary Tract Infection    Urinary Tract Infection  Associated symptoms include frequency and urgency. Pertinent negatives include no hematuria, nausea or vomiting.   Patient is in today for ***  Past Medical History:  Diagnosis Date   Atypical ductal hyperplasia of breast 03/17/2015   Atypical ductal hyperplasia, breast    Glaucoma     Past Surgical History:  Procedure Laterality Date   COLONOSCOPY  04/05/2016   RADIOACTIVE SEED GUIDED EXCISIONAL BREAST BIOPSY Left 03/23/2015   Procedure: RADIOACTIVE SEED GUIDED EXCISIONAL LEFT BREAST BIOPSY;  Surgeon: Rolm Bookbinder, MD;  Location: [facility];  Service: General;  Laterality: Left;   WISDOM TOOTH EXTRACTION      Family History  Problem Relation Age of Onset   Stroke Mother    Osteoarthritis Mother    Deep vein thrombosis Mother    Neuropathy Mother    Arthritis/Rheumatoid Sister     Social History   Socioeconomic History   Marital status: Widowed    Spouse name: Not on file   Number of children: 1   Years of education: Not on file   Highest education level: Not on file  Occupational History   Occupation: [employer]  Tobacco Use   Smoking status: Never   Smokeless tobacco: Never  Vaping Use   Vaping Use: Never used  Substance and Sexual Activity   Alcohol use: Yes    Alcohol/week: 1.0 standard drink of alcohol    Types: 1 Glasses of wine per week    Comment: occas wine   Drug use: Never   Sexual activity: Not on file  Other Topics Concern   Not on file  Social History Narrative   Not on file   Social Determinants of Health   Financial Resource Strain: Not on file  Food Insecurity: No Food Insecurity (05/28/2021)   Hunger Vital Sign    Worried About Running Out of Food in the Last Year: Never true    Ran Out  of Food in the Last Year: Never true  Transportation Needs: No Transportation Needs (05/28/2021)   PRAPARE - Hydrologist (Medical): No    Lack of Transportation (Non-Medical): No  Physical Activity: Sufficiently Active (05/28/2021)   Exercise Vital Sign    Days of Exercise per Week: 6 days    Minutes of Exercise per Session: 90 min  Stress: Not on file  Social Connections: Not on file  Intimate Partner Violence: Not on file    Outpatient Medications Prior to Visit  Medication Sig Dispense Refill   fluorouracil (EFUDEX) 5 % cream 1 application     latanoprost (XALATAN) 0.005 % ophthalmic solution      timolol (TIMOPTIC) 0.25 % ophthalmic solution 1 drop into affected eye     tretinoin (RETIN-A) 3.474 % cream 1 application in the evening to face     No facility-administered medications prior to visit.    No Known Allergies  Review of Systems  Constitutional:  Negative for appetite change, fatigue and fever.  HENT:  Negative for congestion, ear pain, sinus pressure and sore throat.   Respiratory:  Negative for cough, chest tightness, shortness of breath and wheezing.   Cardiovascular:  Negative for chest pain and palpitations.  Gastrointestinal:  Positive for abdominal pain (  Lower abdominal pain/pressure). Negative for constipation, diarrhea, nausea and vomiting.  Genitourinary:  Positive for frequency and urgency. Negative for dysuria and hematuria.  Musculoskeletal:  Negative for arthralgias, back pain, joint swelling and myalgias.  Skin:  Negative for rash.  Neurological:  Negative for dizziness, weakness and headaches.  Psychiatric/Behavioral:  Negative for dysphoric mood. The patient is not nervous/anxious.        Objective:    Physical Exam Vitals reviewed.  Constitutional:      Appearance: Normal appearance. She is normal weight.  Cardiovascular:     Rate and Rhythm: Normal rate and regular rhythm.     Heart sounds: Normal heart sounds.   Pulmonary:     Effort: Pulmonary effort is normal.     Breath sounds: Normal breath sounds.  Abdominal:     General: Abdomen is flat. Bowel sounds are normal.     Palpations: Abdomen is soft.  Neurological:     Mental Status: She is alert and oriented to person, place, and time.  Psychiatric:        Mood and Affect: Mood normal.        Behavior: Behavior normal.     BP 110/72 (BP Location: Left Arm, Patient Position: Sitting)   Pulse 70   Temp 99 F (37.2 C) (Temporal)   Ht 5' 5"  (1.651 m)   Wt 141 lb (64 kg)   SpO2 97%   BMI 23.46 kg/m  Wt Readings from Last 3 Encounters:  01/27/22 141 lb (64 kg)  05/28/21 149 lb 6.4 oz (67.8 kg)  11/27/20 147 lb (66.7 kg)    Health Maintenance Due  Topic Date Due   Hepatitis C Screening  Never done   TETANUS/TDAP  Never done   Zoster Vaccines- Shingrix (1 of 2) Never done   COVID-19 Vaccine (4 - Pfizer risk series) 04/16/2020   INFLUENZA VACCINE  12/21/2021    There are no preventive care reminders to display for this patient.   Lab Results  Component Value Date   TSH 2.37 08/10/2006   Lab Results  Component Value Date   WBC 6.3 11/27/2020   HGB 13.9 11/27/2020   HCT 40.1 11/27/2020   MCV 86 11/27/2020   PLT 261 11/27/2020   Lab Results  Component Value Date   NA 140 11/27/2020   K 4.8 11/27/2020   CO2 25 11/27/2020   GLUCOSE 94 11/27/2020   BUN 11 11/27/2020   CREATININE 0.93 11/27/2020   BILITOT 0.4 11/27/2020   ALKPHOS 64 11/27/2020   AST 14 11/27/2020   ALT 14 11/27/2020   PROT 6.6 11/27/2020   ALBUMIN 4.5 11/27/2020   CALCIUM 10.0 11/27/2020   EGFR 65 11/27/2020   Lab Results  Component Value Date   CHOL 186 08/10/2006   Lab Results  Component Value Date   HDL 114.0 08/10/2006   Lab Results  Component Value Date   LDLCALC 54 08/10/2006   Lab Results  Component Value Date   TRIG 91 08/10/2006   Lab Results  Component Value Date   CHOLHDL 1.6 CALC 08/10/2006   No results found for:  "HGBA1C"       Assessment & Plan:   There are no diagnoses linked to this encounter.   No orders of the defined types were placed in this encounter.   I,Judaea Burgoon M Dasani Crear,acting as a Education administrator for [employer], NP.,have documented all relevant documentation on the behalf of Rip Harbour, NP,as directed by  Rip Harbour, NP  while in the presence of Rip Harbour, NP.   Oleta Mouse, CMA

## 2022-01-27 NOTE — Patient Instructions (Addendum)
Take Macrobid twice daily with food for 7 days Continue pushing fluids Follow-up as needed   Urinary Tract Infection, Adult A urinary tract infection (UTI) is an infection of any part of the urinary tract. The urinary tract includes: The kidneys. The ureters. The bladder. The urethra. These organs make, store, and get rid of pee (urine) in the body. What are the causes? This infection is caused by germs (bacteria) in your genital area. These germs grow and cause swelling (inflammation) of your urinary tract. What increases the risk? The following factors may make you more likely to develop this condition: Using a small, thin tube (catheter) to drain pee. Not being able to control when you pee or poop (incontinence). Being female. If you are female, these things can increase the risk: Using these methods to prevent pregnancy: A medicine that kills sperm (spermicide). A device that blocks sperm (diaphragm). Having low levels of a female hormone (estrogen). Being pregnant. You are more likely to develop this condition if: You have genes that add to your risk. You are sexually active. You take antibiotic medicines. You have trouble peeing because of: A prostate that is bigger than normal, if you are female. A blockage in the part of your body that drains pee from the bladder. A kidney stone. A nerve condition that affects your bladder. Not getting enough to drink. Not peeing often enough. You have other conditions, such as: Diabetes. A weak disease-fighting system (immune system). Sickle cell disease. Gout. Injury of the spine. What are the signs or symptoms? Symptoms of this condition include: Needing to pee right away. Peeing small amounts often. Pain or burning when peeing. Blood in the pee. Pee that smells bad or not like normal. Trouble peeing. Pee that is cloudy. Fluid coming from the vagina, if you are female. Pain in the belly or lower back. Other symptoms  include: Vomiting. Not feeling hungry. Feeling mixed up (confused). This may be the first symptom in older adults. Being tired and grouchy (irritable). A fever. Watery poop (diarrhea). How is this treated? Taking antibiotic medicine. Taking other medicines. Drinking enough water. In some cases, you may need to see a specialist. Follow these instructions at home:  Medicines Take over-the-counter and prescription medicines only as told by your doctor. If you were prescribed an antibiotic medicine, take it as told by your doctor. Do not stop taking it even if you start to feel better. General instructions Make sure you: Pee until your bladder is empty. Do not hold pee for a long time. Empty your bladder after sex. Wipe from front to back after peeing or pooping if you are a female. Use each tissue one time when you wipe. Drink enough fluid to keep your pee pale yellow. Keep all follow-up visits. Contact a doctor if: You do not get better after 1-2 days. Your symptoms go away and then come back. Get help right away if: You have very bad back pain. You have very bad pain in your lower belly. You have a fever. You have chills. You feeling like you will vomit or you vomit. Summary A urinary tract infection (UTI) is an infection of any part of the urinary tract. This condition is caused by germs in your genital area. There are many risk factors for a UTI. Treatment includes antibiotic medicines. Drink enough fluid to keep your pee pale yellow. This information is not intended to replace advice given to you by your health care provider. Make sure you discuss any questions  you have with your health care provider. Document Revised: 12/20/2019 Document Reviewed: 12/20/2019 Elsevier Patient Education  [place].

## 2022-01-27 NOTE — Progress Notes (Signed)
   Acute Office Visit  Subjective:     Patient ID: Monique Atkins, female    DOB: [birth year], 76 y.o.   MRN: [id number]  Chief Complaint  Patient presents with   Urinary Tract Infection    HPI Patient is in today for urinary frequency, urgency, and dysuria. Onset was three days ago. Treatment has included pushing fluids. Denies fever, chills, or body aches.   ROS See pertinent positives and negatives per HPI.      Objective:    BP 110/72 (BP Location: Left Arm, Patient Position: Sitting)   Pulse 70   Temp 99 F (37.2 C) (Temporal)   Ht '5\' 5"'$  (1.651 m)   Wt 141 lb (64 kg)   SpO2 97%   BMI 23.46 kg/m    Physical Exam Vitals reviewed.  Constitutional:      Appearance: Normal appearance.  Skin:    General: Skin is warm and dry.     Capillary Refill: Capillary refill takes less than 2 seconds.  Neurological:     General: No focal deficit present.     Mental Status: She is alert and oriented to person, place, and time.  Psychiatric:        Mood and Affect: Mood normal.        Behavior: Behavior normal.         Assessment & Plan:   1. Acute cystitis without hematuria - nitrofurantoin, macrocrystal-monohydrate, (MACROBID) 100 MG capsule; Take 1 capsule (100 mg total) by mouth 2 (two) times daily.  Dispense: 14 capsule; Refill: 0  2. Urinary frequency - POCT urinalysis dipstick    Take Macrobid twice daily with food for 7 days Continue pushing fluids Follow-up as needed  Follow-up: As needed  I, Rip Harbour, NP, have reviewed all documentation for this visit. The documentation on 01/27/22 for the exam, diagnosis, procedures, and orders are all accurate and complete.   Signed, Rip Harbour, NP 01/27/22 at 8:15 pm

## 2022-02-14 DIAGNOSIS — Z23 Encounter for immunization: Secondary | ICD-10-CM | POA: Diagnosis not present

## 2022-04-22 DIAGNOSIS — H52203 Unspecified astigmatism, bilateral: Secondary | ICD-10-CM | POA: Diagnosis not present

## 2022-04-22 DIAGNOSIS — Z961 Presence of intraocular lens: Secondary | ICD-10-CM | POA: Diagnosis not present

## 2022-04-22 DIAGNOSIS — H401121 Primary open-angle glaucoma, left eye, mild stage: Secondary | ICD-10-CM | POA: Diagnosis not present

## 2022-04-22 DIAGNOSIS — H401112 Primary open-angle glaucoma, right eye, moderate stage: Secondary | ICD-10-CM | POA: Diagnosis not present

## 2022-07-02 DIAGNOSIS — Z1231 Encounter for screening mammogram for malignant neoplasm of breast: Secondary | ICD-10-CM | POA: Diagnosis not present

## 2022-07-02 LAB — MM OUTSIDE FILMS MAMMO

## 2022-07-08 DIAGNOSIS — R922 Inconclusive mammogram: Secondary | ICD-10-CM | POA: Diagnosis not present

## 2022-07-08 DIAGNOSIS — R928 Other abnormal and inconclusive findings on diagnostic imaging of breast: Secondary | ICD-10-CM | POA: Diagnosis not present

## 2022-07-12 IMAGING — CT CT HEART MORP W/ CTA COR W/ SCORE W/ CA W/CM &/OR W/O CM
4 of 7 series · 8 of 20 positions shown, 9 images · IV contrast (APPLIED)
Comparison: Chest CT 03/14/2003.
COMPARISON: Chest CT 03/14/2003.

Addendum:
EXAM:
OVER-READ INTERPRETATION  CT CHEST

The following report is an over-read performed by radiologist Dr.
William Y Candida Agron Cordero [REDACTED] on 03/18/2020. This
over-read does not include interpretation of cardiac or coronary
anatomy or pathology. The coronary calcium score/coronary CTA
interpretation by the cardiologist is attached.
CLINICAL DATA: 74 year old male with chest pain.
Cardiac/Coronary  CT
TECHNIQUE: The patient was scanned on a [employer] Force scanner.

[Series 6: best diast 72 % · axial · 0.39mm/px · z∈[+1116,+1161]mm · 2 of 341 slices shown, 3 images]
[im 114/341  vessel]
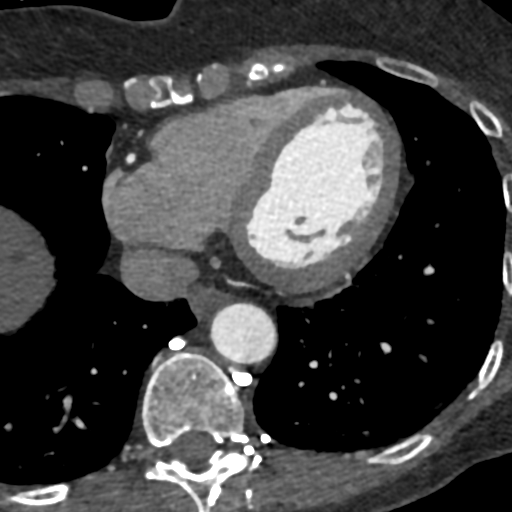
[im 114/341  lung]
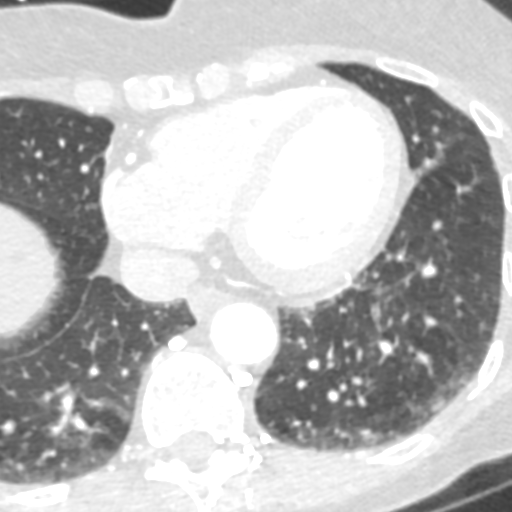
[im 227/341  vessel]
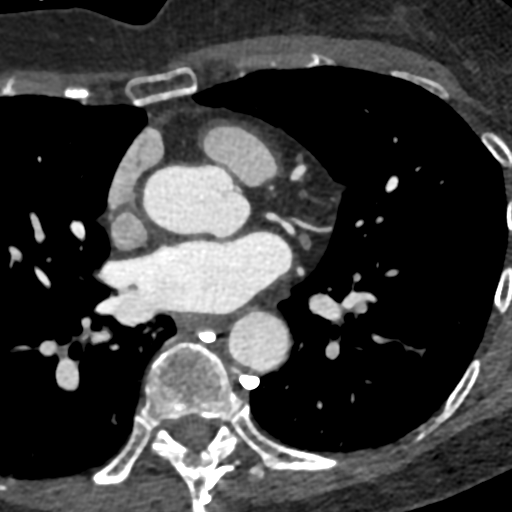

[Series 7: best syst 39 % · axial · 0.39mm/px · z∈[+1116,+1161]mm · 2 of 341 slices shown]
[im 114/341  vessel]
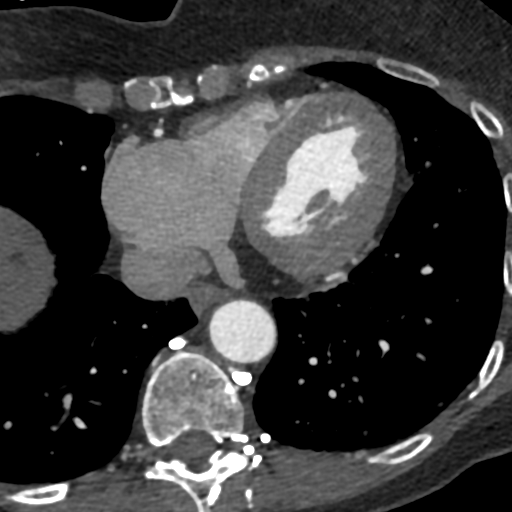
[im 227/341  vessel]
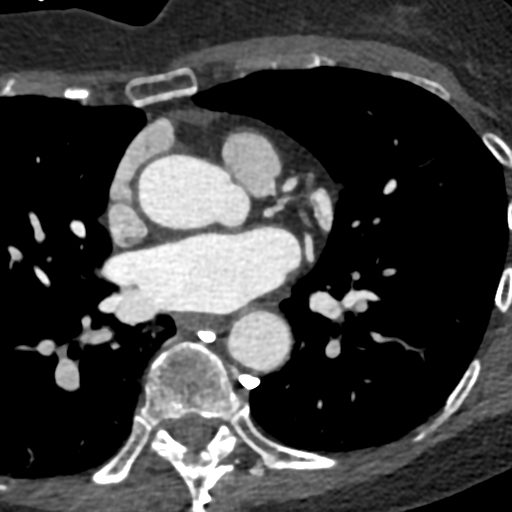

[Series 8: ts diast sharp 72 % · axial · 0.39mm/px · z∈[+1116,+1161]mm · 2 of 341 slices shown]
[im 114/341  lung]
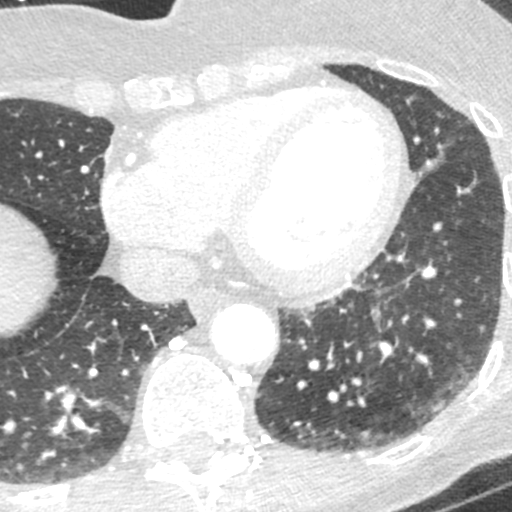
[im 227/341  lung]
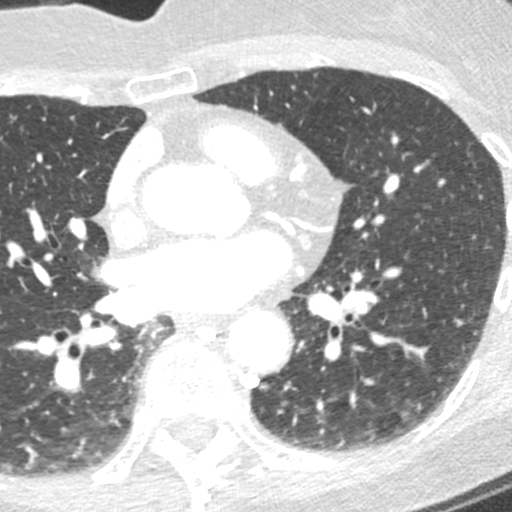

[Series 9: ts syst sharp 39 % · axial · 0.39mm/px · z∈[+1116,+1161]mm · 2 of 341 slices shown]
[im 114/341  lung]
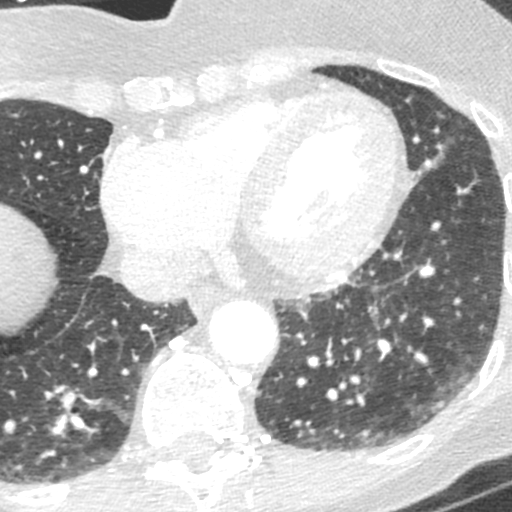
[im 227/341  lung]
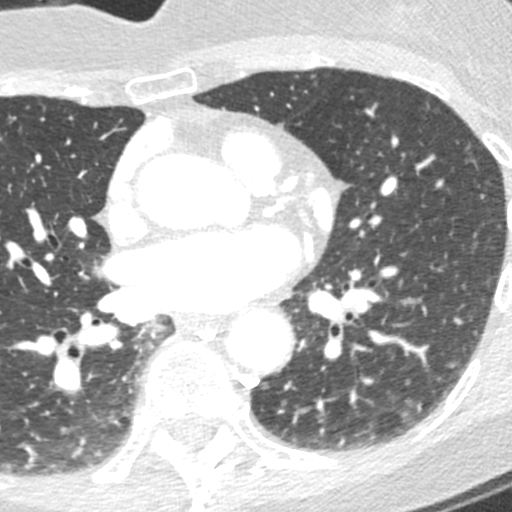

[8 of 20 positions shown; findings below may reference images not displayed]

FINDINGS: Aortic atherosclerosis. Within the visualized portions of the thorax
there are no suspicious appearing pulmonary nodules or masses, there
is no acute consolidative airspace disease, no pleural effusions, no
pneumothorax and no lymphadenopathy. Visualized portions of the
upper abdomen are unremarkable. There are no aggressive appearing
lytic or blastic lesions noted in the visualized portions of the
skeleton.
IMPRESSION: 1.  Aortic Atherosclerosis (R1BGT-PYL.L).
FINDINGS: A 120 kV prospective scan was triggered in the descending thoracic
aorta at 111 HU's. Axial non-contrast 3 mm slices were carried out
through the heart. The data set was analyzed on a dedicated work
station and scored using the Agatson method. Gantry rotation speed
was 250 msecs and collimation was .6 mm. No beta blockade and 0.8 mg
of sl NTG was given. The 3D data set was reconstructed in 5%
intervals of the 67-82 % of the R-R cycle. Diastolic phases were
analyzed on a dedicated work station using MPR, MIP and VRT modes.
The patient received 80 cc of contrast.

Aorta: Normal size.  No calcifications.  No dissection.

Aortic Valve:  Trileaflet.  No calcifications.

Coronary Arteries:  Normal coronary origin.  Right dominance.

RCA is a large dominant artery that gives rise to PDA and PLVB.
There is no plaque.

Left main is a large artery that gives rise to LAD and LCX arteries.

LAD is a large vessel that has no plaque.

LCX is a non-dominant artery that gives rise to one large OM1 branch
with a very high take off. There is no plaque.

Other findings:

Normal pulmonary vein drainage into the left atrium.

Normal let atrial appendage without a thrombus.

Normal size of the pulmonary artery.
IMPRESSION: 1. Coronary calcium score of 0. This was 0 percentile for age and
sex matched control.

2. Normal coronary origin with right dominance.

3. No evidence of CAD.

Olli-Mikko Ramsay, DO

*** End of Addendum ***
EXAM:
OVER-READ INTERPRETATION  CT CHEST

The following report is an over-read performed by radiologist Dr.
William Y Candida Agron Cordero [REDACTED] on 03/18/2020. This
over-read does not include interpretation of cardiac or coronary
anatomy or pathology. The coronary calcium score/coronary CTA
interpretation by the cardiologist is attached.
FINDINGS: Aortic atherosclerosis. Within the visualized portions of the thorax
there are no suspicious appearing pulmonary nodules or masses, there
is no acute consolidative airspace disease, no pleural effusions, no
pneumothorax and no lymphadenopathy. Visualized portions of the
upper abdomen are unremarkable. There are no aggressive appearing
lytic or blastic lesions noted in the visualized portions of the
skeleton.
IMPRESSION: 1.  Aortic Atherosclerosis (R1BGT-PYL.L).

## 2022-07-19 DIAGNOSIS — M1990 Unspecified osteoarthritis, unspecified site: Secondary | ICD-10-CM | POA: Diagnosis not present

## 2022-07-19 DIAGNOSIS — R0600 Dyspnea, unspecified: Secondary | ICD-10-CM | POA: Diagnosis not present

## 2022-07-19 DIAGNOSIS — Z87891 Personal history of nicotine dependence: Secondary | ICD-10-CM | POA: Diagnosis not present

## 2022-07-19 DIAGNOSIS — J180 Bronchopneumonia, unspecified organism: Secondary | ICD-10-CM | POA: Diagnosis not present

## 2022-07-19 DIAGNOSIS — R059 Cough, unspecified: Secondary | ICD-10-CM | POA: Diagnosis not present

## 2022-07-20 ENCOUNTER — Ambulatory Visit: Payer: Medicare Other | Admitting: Physician Assistant

## 2022-07-25 ENCOUNTER — Telehealth: Payer: Self-pay

## 2022-07-25 NOTE — Telephone Encounter (Signed)
Ady was scheduled for follow-up with Dr. Tobie Poet tomorrow.  She will complete her antibiotic tomorrow and her symptoms have not cleared.

## 2022-07-26 ENCOUNTER — Encounter: Payer: Self-pay | Admitting: Family Medicine

## 2022-07-26 ENCOUNTER — Ambulatory Visit (INDEPENDENT_AMBULATORY_CARE_PROVIDER_SITE_OTHER): Payer: Medicare Other | Admitting: Family Medicine

## 2022-07-26 VITALS — BP 130/72 | HR 78 | Temp 98.2°F | Ht 65.5 in | Wt 130.0 lb

## 2022-07-26 DIAGNOSIS — J180 Bronchopneumonia, unspecified organism: Secondary | ICD-10-CM | POA: Diagnosis not present

## 2022-07-26 NOTE — Progress Notes (Unsigned)
Subjective:  Patient ID: Monique Atkins, female    DOB: November 14, 1945  Age: 77 y.o. MRN: [id number]  Chief Complaint  Patient presents with   Follow-up    HPI   Follow up ER visit  Patient was seen in ER for [facility] on 07/19/2022. She was treated for bronchopneumonia. Treatment for this included Augmentin, tessalon perles, prednisone. She reports excellent compliance with treatment. She reports this condition is Improved. She continues to have a cough. No fever, chills, sweats  Having nasal congestion and PND. .      07/26/2022    3:09 PM 05/28/2021   10:24 AM 04/14/2020    9:06 AM 02/14/2020   10:18 AM  Depression screen PHQ 2/9  Decreased Interest 0 0 0 0  Down, Depressed, Hopeless 0 0 0 0  PHQ - 2 Score 0 0 0 0         02/14/2020   10:19 AM 04/14/2020    9:06 AM 08/31/2020    3:20 PM 05/28/2021   10:29 AM 07/26/2022    3:09 PM  Fall Risk  Falls in the past year? 0 0 0 0 0  Was there an injury with Fall? 0 0 0 0 0  Fall Risk Category Calculator 0 0 0 0 0  Fall Risk Category (Retired) Low Low Low Low   (RETIRED) Patient Fall Risk Level Low fall risk Low fall risk Low fall risk Low fall risk   Patient at Risk for Falls Due to  No Fall Risks No Fall Risks No Fall Risks No Fall Risks  Fall risk Follow up   Falls evaluation completed Falls evaluation completed Falls evaluation completed      Review of Systems  Constitutional:  Negative for chills, fatigue and fever.  HENT:  Positive for congestion and postnasal drip. Negative for ear pain, rhinorrhea and sore throat.   Respiratory:  Positive for cough and shortness of breath.   Cardiovascular:  Negative for chest pain.  Gastrointestinal:  Negative for abdominal pain, constipation, diarrhea, nausea and vomiting.  Genitourinary:  Negative for dysuria and urgency.  Musculoskeletal:  Negative for back pain and myalgias.  Neurological:  Negative for dizziness, weakness, light-headedness and headaches.   Psychiatric/Behavioral:  Negative for dysphoric mood. The patient is not nervous/anxious.     Current Outpatient Medications on File Prior to Visit  Medication Sig Dispense Refill   amoxicillin-clavulanate (AUGMENTIN) 875-125 MG tablet Take 1 tablet by mouth 2 (two) times daily.     benzonatate (TESSALON) 100 MG capsule Take 100 mg by mouth 3 (three) times daily as needed.     latanoprost (XALATAN) 0.005 % ophthalmic solution      timolol (TIMOPTIC) 0.25 % ophthalmic solution 1 drop into affected eye     tretinoin (RETIN-A) [id number] % cream 1 application in the evening to face     No current facility-administered medications on file prior to visit.   Past Medical History:  Diagnosis Date   Atypical ductal hyperplasia of breast 03/17/2015   Atypical ductal hyperplasia, breast    Glaucoma    Past Surgical History:  Procedure Laterality Date   COLONOSCOPY  04/05/2016   RADIOACTIVE SEED GUIDED EXCISIONAL BREAST BIOPSY Left 03/23/2015   Procedure: RADIOACTIVE SEED GUIDED EXCISIONAL LEFT BREAST BIOPSY;  Surgeon: Rolm Bookbinder, MD;  Location: [place];  Service: General;  Laterality: Left;   WISDOM TOOTH EXTRACTION      Family History  Problem Relation Age of Onset   Stroke Mother  Osteoarthritis Mother    Deep vein thrombosis Mother    Neuropathy Mother    Arthritis/Rheumatoid Sister    Social History   Socioeconomic History   Marital status: Married    Spouse name: Not on file   Number of children: 1   Years of education: Not on file   Highest education level: Not on file  Occupational History   Occupation: [employer]  Tobacco Use   Smoking status: Never   Smokeless tobacco: Never  Vaping Use   Vaping Use: Never used  Substance and Sexual Activity   Alcohol use: Yes    Alcohol/week: 1.0 standard drink of alcohol    Types: 1 Glasses of wine per week    Comment: occas wine   Drug use: Never   Sexual activity: Not on file  Other Topics Concern    Not on file  Social History Narrative   Not on file   Social Determinants of Health   Financial Resource Strain: Low Risk  (07/26/2022)   Overall Financial Resource Strain (CARDIA)    Difficulty of Paying Living Expenses: Not hard at all  Food Insecurity: No Food Insecurity (05/28/2021)   Hunger Vital Sign    Worried About Running Out of Food in the Last Year: Never true    Ran Out of Food in the Last Year: Never true  Transportation Needs: No Transportation Needs (05/28/2021)   PRAPARE - Hydrologist (Medical): No    Lack of Transportation (Non-Medical): No  Physical Activity: Sufficiently Active (05/28/2021)   Exercise Vital Sign    Days of Exercise per Week: 6 days    Minutes of Exercise per Session: 90 min  Stress: No Stress Concern Present (07/26/2022)   [place]    Feeling of Stress : Not at all  Social Connections: Moderately Isolated (07/26/2022)   Social Connection and Isolation Panel [NHANES]    Frequency of Communication with Friends and Family: Three times a week    Frequency of Social Gatherings with Friends and Family: Three times a week    Attends Religious Services: Never    Active Member of Clubs or Organizations: No    Attends Archivist Meetings: Never    Marital Status: Married    Objective:  BP 130/72   Pulse 78   Temp 98.2 F (36.8 C)   Ht 5' 5.5" (1.664 m)   Wt 130 lb (59 kg)   SpO2 94%   BMI 21.30 kg/m      07/26/2022    3:08 PM 01/27/2022    3:33 PM 05/28/2021   10:21 AM  BP/Weight  Systolic BP [id number]  Diastolic BP 72 72 80  Wt. (Lbs) 130 141 149.4  BMI 21.3 kg/m2 23.46 kg/m2 24.86 kg/m2    Physical Exam Vitals reviewed.  Constitutional:      Appearance: Normal appearance.  HENT:     Right Ear: Tympanic membrane, ear canal and external ear normal.     Left Ear: Tympanic membrane, ear canal and external ear normal.     Nose: Nose  normal.     Mouth/Throat:     Pharynx: Oropharynx is clear.  Cardiovascular:     Rate and Rhythm: Normal rate and regular rhythm.     Heart sounds: Normal heart sounds. No murmur heard. Pulmonary:     Effort: Pulmonary effort is normal. No respiratory distress.     Breath sounds:  Normal breath sounds.     Comments: COUGHING WITH INHALATION. Lymphadenopathy:     Cervical: No cervical adenopathy.  Neurological:     Mental Status: She is alert and oriented to person, place, and time.  Psychiatric:        Mood and Affect: Mood normal.        Behavior: Behavior normal.     Diabetic Foot Exam - Simple   No data filed      Lab Results  Component Value Date   WBC 6.3 11/27/2020   HGB 13.9 11/27/2020   HCT 40.1 11/27/2020   PLT 261 11/27/2020   GLUCOSE 94 11/27/2020   CHOL 186 08/10/2006   TRIG 91 08/10/2006   HDL 114.0 08/10/2006   LDLCALC 54 08/10/2006   ALT 14 11/27/2020   AST 14 11/27/2020   NA 140 11/27/2020   K 4.8 11/27/2020   CL 101 11/27/2020   CREATININE 0.93 11/27/2020   BUN 11 11/27/2020   CO2 25 11/27/2020   TSH 2.37 08/10/2006      Assessment & Plan:    Bronchopneumonia Assessment & Plan: Ordered Chest xray 2v. Had atypical pneumonia. Rx: zpack  Orders: -     DG Chest 2 View; Future     No orders of the defined types were placed in this encounter.   Orders Placed This Encounter  Procedures   DG Chest 2 View     Follow-up: Return if symptoms worsen or fail to improve.   I,Katherina A Bramblett,acting as a scribe for Rochel Brome, MD.,have documented all relevant documentation on the behalf of Rochel Brome, MD,as directed by  Rochel Brome, MD while in the presence of Rochel Brome, MD.   Geralynn Ochs I Leal-Borjas,acting as a scribe for Rochel Brome, MD.,have documented all relevant documentation on the behalf of Rochel Brome, MD,as directed by  Rochel Brome, MD while in the presence of Rochel Brome, MD.     An After Visit Summary was printed and  given to the patient.  I attest that I have reviewed this visit and agree with the plan scribed by my staff.   Rochel Brome, MD [facility] [phone]

## 2022-07-27 ENCOUNTER — Telehealth: Payer: Self-pay

## 2022-07-27 NOTE — Telephone Encounter (Signed)
     Patient  visit on 2/24  at [facility]    Have you been able to follow up with your primary care physician? Yes   The patient was or was not able to obtain any needed medicine or equipment. Yes   Are there diet recommendations that you are having difficulty following? Na   Patient expresses understanding of discharge instructions and education provided has no other needs at this time.  Yes      [facility] [phone] [address], [place], [place] [address] Phone: [phone] Email: [email or web address]

## 2022-07-28 DIAGNOSIS — J180 Bronchopneumonia, unspecified organism: Secondary | ICD-10-CM | POA: Diagnosis not present

## 2022-07-28 DIAGNOSIS — R059 Cough, unspecified: Secondary | ICD-10-CM | POA: Diagnosis not present

## 2022-07-29 ENCOUNTER — Other Ambulatory Visit: Payer: Self-pay | Admitting: Family Medicine

## 2022-07-29 MED ORDER — AZITHROMYCIN 250 MG PO TABS: ORAL_TABLET | ORAL | 0 refills | Status: DC

## 2022-07-30 DIAGNOSIS — J180 Bronchopneumonia, unspecified organism: Secondary | ICD-10-CM | POA: Insufficient documentation

## 2022-07-30 NOTE — Assessment & Plan Note (Signed)
Ordered Chest xray 2v. Had atypical pneumonia. Rx: zpack

## 2022-08-01 ENCOUNTER — Other Ambulatory Visit: Payer: Self-pay

## 2022-08-01 DIAGNOSIS — J180 Bronchopneumonia, unspecified organism: Secondary | ICD-10-CM

## 2022-11-02 DIAGNOSIS — H401121 Primary open-angle glaucoma, left eye, mild stage: Secondary | ICD-10-CM | POA: Diagnosis not present

## 2022-11-02 DIAGNOSIS — H401112 Primary open-angle glaucoma, right eye, moderate stage: Secondary | ICD-10-CM | POA: Diagnosis not present

## 2022-11-18 ENCOUNTER — Ambulatory Visit (INDEPENDENT_AMBULATORY_CARE_PROVIDER_SITE_OTHER): Payer: Medicare Other | Admitting: Family Medicine

## 2022-11-18 ENCOUNTER — Encounter: Payer: Self-pay | Admitting: Family Medicine

## 2022-11-18 VITALS — BP 132/80 | HR 63 | Temp 97.5°F | Ht 65.5 in | Wt 143.0 lb

## 2022-11-18 DIAGNOSIS — M25551 Pain in right hip: Secondary | ICD-10-CM | POA: Diagnosis not present

## 2022-11-18 DIAGNOSIS — M5431 Sciatica, right side: Secondary | ICD-10-CM

## 2022-11-18 DIAGNOSIS — M160 Bilateral primary osteoarthritis of hip: Secondary | ICD-10-CM | POA: Diagnosis not present

## 2022-11-18 DIAGNOSIS — R310 Gross hematuria: Secondary | ICD-10-CM | POA: Diagnosis not present

## 2022-11-18 LAB — POCT URINALYSIS DIP (CLINITEK)
Bilirubin, UA: NEGATIVE
Glucose, UA: NEGATIVE mg/dL
Ketones, POC UA: NEGATIVE mg/dL
Nitrite, UA: NEGATIVE
POC PROTEIN,UA: NEGATIVE
Spec Grav, UA: 1.01 (ref 1.010–1.025)
Urobilinogen, UA: 0.2 E.U./dL
pH, UA: 6 (ref 5.0–8.0)

## 2022-11-18 MED ORDER — PREDNISONE 50 MG PO TABS: 50.0000 mg | ORAL_TABLET | Freq: Every day | ORAL | 0 refills | Status: DC

## 2022-11-18 NOTE — Progress Notes (Signed)
Acute Office Visit  Subjective:    Patient ID: Monique Atkins, female    DOB: [birth year], 77 y.o.   MRN: [id number]  Chief Complaint  Patient presents with  . Back Pain    HPI: Patient is in today for right sided lower back/hip pain. Radiates-tingling feeling down right thigh. Constant--at times eases off but is still there.Sitting for hours at a time or standing for extensive amounts of time cause the pain to worsen. Has tried exercises  which have not helped very much. Ibuprofen does help.  Past Medical History:  Diagnosis Date  . Atypical ductal hyperplasia of breast 03/17/2015  . Atypical ductal hyperplasia, breast   . Glaucoma     Past Surgical History:  Procedure Laterality Date  . COLONOSCOPY  04/05/2016  . RADIOACTIVE SEED GUIDED EXCISIONAL BREAST BIOPSY Left 03/23/2015   Procedure: RADIOACTIVE SEED GUIDED EXCISIONAL LEFT BREAST BIOPSY;  Surgeon: Emelia Loron, MD;  Location: [facility];  Service: General;  Laterality: Left;  . WISDOM TOOTH EXTRACTION      Family History  Problem Relation Age of Onset  . Stroke Mother   . Osteoarthritis Mother   . Deep vein thrombosis Mother   . Neuropathy Mother   . Arthritis/Rheumatoid Sister     Social History   Socioeconomic History  . Marital status: Married    Spouse name: Not on file  . Number of children: 1  . Years of education: Not on file  . Highest education level: Not on file  Occupational History  . Occupation: [employer]  Tobacco Use  . Smoking status: Never  . Smokeless tobacco: Never  Vaping Use  . Vaping Use: Never used  Substance and Sexual Activity  . Alcohol use: Yes    Alcohol/week: 1.0 standard drink of alcohol    Types: 1 Glasses of wine per week    Comment: occas wine  . Drug use: Never  . Sexual activity: Not on file  Other Topics Concern  . Not on file  Social History Narrative  . Not on file   Social Determinants of Health   Financial Resource  Strain: Low Risk  (07/26/2022)   Overall Financial Resource Strain (CARDIA)   . Difficulty of Paying Living Expenses: Not hard at all  Food Insecurity: No Food Insecurity (05/28/2021)   Hunger Vital Sign   . Worried About Programme researcher, broadcasting/film/video in the Last Year: Never true   . Ran Out of Food in the Last Year: Never true  Transportation Needs: No Transportation Needs (05/28/2021)   PRAPARE - Transportation   . Lack of Transportation (Medical): No   . Lack of Transportation (Non-Medical): No  Physical Activity: Sufficiently Active (05/28/2021)   Exercise Vital Sign   . Days of Exercise per Week: 6 days   . Minutes of Exercise per Session: 90 min  Stress: No Stress Concern Present (07/26/2022)   [employer] of Occupational Health - Occupational Stress Questionnaire   . Feeling of Stress : Not at all  Social Connections: Moderately Isolated (07/26/2022)   Social Connection and Isolation Panel [NHANES]   . Frequency of Communication with Friends and Family: Three times a week   . Frequency of Social Gatherings with Friends and Family: Three times a week   . Attends Religious Services: Never   . Active Member of Clubs or Organizations: No   . Attends Banker Meetings: Never   . Marital Status: Married  Catering manager Violence: Not At  Risk (07/26/2022)   Humiliation, Afraid, Rape, and Kick questionnaire   . Fear of Current or Ex-Partner: No   . Emotionally Abused: No   . Physically Abused: No   . Sexually Abused: No    Outpatient Medications Prior to Visit  Medication Sig Dispense Refill  . latanoprost (XALATAN) 0.005 % ophthalmic solution     . timolol (TIMOPTIC) 0.25 % ophthalmic solution 1 drop into affected eye    . tretinoin (RETIN-A) 0.025 % cream 1 application in the evening to face    . amoxicillin-clavulanate (AUGMENTIN) 875-125 MG tablet Take 1 tablet by mouth 2 (two) times daily.    Marland Kitchen azithromycin (ZITHROMAX) 250 MG tablet 2 DAILY FOR FIRST DAY, THEN DECREASE TO ONE  DAILY FOR 4 MORE DAYS. 6 tablet 0  . benzonatate (TESSALON) 100 MG capsule Take 100 mg by mouth 3 (three) times daily as needed.     No facility-administered medications prior to visit.    No Known Allergies  Review of Systems  Constitutional:  Negative for chills, fatigue and fever.  Respiratory:  Negative for cough and shortness of breath.   Cardiovascular:  Negative for chest pain.  Genitourinary:  Negative for dysuria and urgency.  Musculoskeletal:  Positive for back pain. Negative for myalgias.       Objective:        11/18/2022   11:02 AM 07/26/2022    3:08 PM 01/27/2022    3:33 PM  Vitals with BMI  Height 5' 5.5" 5' 5.5" 5\' 5"   Weight 143 lbs 130 lbs 141 lbs  BMI 23.43 21.3 23.46  Systolic  130 110  Diastolic  72 72  Pulse 63 78 70    No data found.   Physical Exam Vitals reviewed.  Constitutional:      Appearance: Normal appearance. She is normal weight.  Neck:     Vascular: No carotid bruit.  Cardiovascular:     Rate and Rhythm: Normal rate and regular rhythm.     Heart sounds: Normal heart sounds.  Pulmonary:     Effort: Pulmonary effort is normal. No respiratory distress.     Breath sounds: Normal breath sounds.  Abdominal:     General: Abdomen is flat. Bowel sounds are normal.     Palpations: Abdomen is soft.     Tenderness: There is no abdominal tenderness.  Neurological:     Mental Status: She is alert and oriented to person, place, and time.  Psychiatric:        Mood and Affect: Mood normal.        Behavior: Behavior normal.   Health Maintenance Due  Topic Date Due  . Hepatitis C Screening  Never done  . DTaP/Tdap/Td (1 - Tdap) Never done  . Zoster Vaccines- Shingrix (1 of 2) Never done  . COVID-19 Vaccine (4 - 2023-24 season) 01/21/2022  . Medicare Annual Wellness (AWV)  05/28/2022  . DEXA SCAN  08/23/2022    There are no preventive care reminders to display for this patient.   Lab Results  Component Value Date   TSH 2.37 08/10/2006    Lab Results  Component Value Date   WBC 6.3 11/27/2020   HGB 13.9 11/27/2020   HCT 40.1 11/27/2020   MCV 86 11/27/2020   PLT 261 11/27/2020   Lab Results  Component Value Date   NA 140 11/27/2020   K 4.8 11/27/2020   CO2 25 11/27/2020   GLUCOSE 94 11/27/2020   BUN 11 11/27/2020   CREATININE  0.93 11/27/2020   BILITOT 0.4 11/27/2020   ALKPHOS 64 11/27/2020   AST 14 11/27/2020   ALT 14 11/27/2020   PROT 6.6 11/27/2020   ALBUMIN 4.5 11/27/2020   CALCIUM 10.0 11/27/2020   EGFR 65 11/27/2020   Lab Results  Component Value Date   CHOL 186 08/10/2006   Lab Results  Component Value Date   HDL 114.0 08/10/2006   Lab Results  Component Value Date   LDLCALC 54 08/10/2006   Lab Results  Component Value Date   TRIG 91 08/10/2006   Lab Results  Component Value Date   CHOLHDL 1.6 CALC 08/10/2006   No results found for: "HGBA1C"     Assessment & Plan:  There are no diagnoses linked to this encounter.   No orders of the defined types were placed in this encounter.   No orders of the defined types were placed in this encounter.    Follow-up: No follow-ups on file.  An After Visit Summary was printed and given to the patient.  Blane Ohara, MD [facility] [phone]

## 2022-11-19 DIAGNOSIS — M25551 Pain in right hip: Secondary | ICD-10-CM | POA: Insufficient documentation

## 2022-11-19 DIAGNOSIS — R310 Gross hematuria: Secondary | ICD-10-CM | POA: Insufficient documentation

## 2022-11-19 NOTE — Assessment & Plan Note (Signed)
Check UA Order Urine culture

## 2022-11-19 NOTE — Assessment & Plan Note (Signed)
Order right hip xray. Sent prednisone

## 2022-11-21 HISTORY — PX: OTHER SURGICAL HISTORY: SHX169

## 2022-11-21 LAB — URINE CULTURE: Organism ID, Bacteria: NO GROWTH

## 2022-11-28 ENCOUNTER — Encounter: Payer: Self-pay | Admitting: Family Medicine

## 2022-11-29 ENCOUNTER — Other Ambulatory Visit: Payer: Self-pay

## 2022-11-29 DIAGNOSIS — M25551 Pain in right hip: Secondary | ICD-10-CM

## 2022-12-15 DIAGNOSIS — M25551 Pain in right hip: Secondary | ICD-10-CM | POA: Diagnosis not present

## 2022-12-15 DIAGNOSIS — M533 Sacrococcygeal disorders, not elsewhere classified: Secondary | ICD-10-CM | POA: Diagnosis not present

## 2022-12-15 DIAGNOSIS — M545 Low back pain, unspecified: Secondary | ICD-10-CM | POA: Diagnosis not present

## 2022-12-27 DIAGNOSIS — M533 Sacrococcygeal disorders, not elsewhere classified: Secondary | ICD-10-CM | POA: Diagnosis not present

## 2022-12-27 DIAGNOSIS — M791 Myalgia, unspecified site: Secondary | ICD-10-CM | POA: Diagnosis not present

## 2023-01-26 DIAGNOSIS — M533 Sacrococcygeal disorders, not elsewhere classified: Secondary | ICD-10-CM | POA: Diagnosis not present

## 2023-01-26 DIAGNOSIS — M5459 Other low back pain: Secondary | ICD-10-CM | POA: Diagnosis not present

## 2023-02-13 DIAGNOSIS — M545 Low back pain, unspecified: Secondary | ICD-10-CM | POA: Diagnosis not present

## 2023-02-13 DIAGNOSIS — M5459 Other low back pain: Secondary | ICD-10-CM | POA: Diagnosis not present

## 2023-02-21 DIAGNOSIS — M533 Sacrococcygeal disorders, not elsewhere classified: Secondary | ICD-10-CM | POA: Diagnosis not present

## 2023-03-07 DIAGNOSIS — M533 Sacrococcygeal disorders, not elsewhere classified: Secondary | ICD-10-CM | POA: Diagnosis not present

## 2023-03-07 DIAGNOSIS — M5416 Radiculopathy, lumbar region: Secondary | ICD-10-CM | POA: Diagnosis not present

## 2023-03-15 DIAGNOSIS — M5451 Vertebrogenic low back pain: Secondary | ICD-10-CM | POA: Diagnosis not present

## 2023-03-15 DIAGNOSIS — M5416 Radiculopathy, lumbar region: Secondary | ICD-10-CM | POA: Diagnosis not present

## 2023-03-15 DIAGNOSIS — M4186 Other forms of scoliosis, lumbar region: Secondary | ICD-10-CM | POA: Diagnosis not present

## 2023-03-30 DIAGNOSIS — M5416 Radiculopathy, lumbar region: Secondary | ICD-10-CM | POA: Diagnosis not present

## 2023-04-05 DIAGNOSIS — M419 Scoliosis, unspecified: Secondary | ICD-10-CM | POA: Diagnosis not present

## 2023-04-05 DIAGNOSIS — M545 Low back pain, unspecified: Secondary | ICD-10-CM | POA: Diagnosis not present

## 2023-04-25 DIAGNOSIS — M545 Low back pain, unspecified: Secondary | ICD-10-CM | POA: Diagnosis not present

## 2023-04-25 DIAGNOSIS — M419 Scoliosis, unspecified: Secondary | ICD-10-CM | POA: Diagnosis not present

## 2023-05-09 DIAGNOSIS — M419 Scoliosis, unspecified: Secondary | ICD-10-CM | POA: Diagnosis not present

## 2023-05-09 DIAGNOSIS — M545 Low back pain, unspecified: Secondary | ICD-10-CM | POA: Diagnosis not present

## 2023-05-12 DIAGNOSIS — Z961 Presence of intraocular lens: Secondary | ICD-10-CM | POA: Diagnosis not present

## 2023-05-12 DIAGNOSIS — H401112 Primary open-angle glaucoma, right eye, moderate stage: Secondary | ICD-10-CM | POA: Diagnosis not present

## 2023-05-12 DIAGNOSIS — H52203 Unspecified astigmatism, bilateral: Secondary | ICD-10-CM | POA: Diagnosis not present

## 2023-07-29 DIAGNOSIS — Z1231 Encounter for screening mammogram for malignant neoplasm of breast: Secondary | ICD-10-CM | POA: Diagnosis not present

## 2023-07-29 LAB — HM MAMMOGRAPHY

## 2023-08-02 ENCOUNTER — Encounter: Payer: Self-pay | Admitting: Family Medicine

## 2023-08-17 DIAGNOSIS — L570 Actinic keratosis: Secondary | ICD-10-CM | POA: Diagnosis not present

## 2023-08-17 DIAGNOSIS — L700 Acne vulgaris: Secondary | ICD-10-CM | POA: Diagnosis not present

## 2023-08-17 DIAGNOSIS — L811 Chloasma: Secondary | ICD-10-CM | POA: Diagnosis not present

## 2023-08-17 DIAGNOSIS — L578 Other skin changes due to chronic exposure to nonionizing radiation: Secondary | ICD-10-CM | POA: Diagnosis not present

## 2023-09-27 ENCOUNTER — Telehealth: Payer: Self-pay

## 2023-09-27 NOTE — Telephone Encounter (Signed)
 Please call this patient to schedule the next AWV appointment with Delford Felling, FNP Last date of AWV: 05/28/2021

## 2023-09-28 NOTE — Progress Notes (Addendum)
 Subjective:   Monique Atkins is a 78 y.o. female who presents for Medicare Annual (Subsequent) preventive examination.  Visit Complete: In person  Patient Medicare AWV questionnaire was completed by the patient on 09/29/2023; I have confirmed that all information answered by patient is correct and no changes since this date.    Discussed the use of AI scribe software for clinical note transcription with the patient, who gave verbal consent to proceed.  History of Present Illness   Monique Atkins is a 78 year old female who presents for annual wellness visit.  She maintains a healthy lifestyle, including regular exercise and a balanced diet. She practices intermittent fasting, eating her last meal at lunchtime and exercising for an hour before breakfast. Her diet includes a variety of fruits, vegetables, whole grains, nuts, and protein sources such as chicken and occasionally petit filet. No fatigue or dizziness.  She has a history of back problems and scoliosis, which she manages with exercise. She experiences hip pain, which she attributes to degenerative changes. She focuses on keeping her muscles strong to support her bones. No fractures despite being 'clumsy'.  She recalls a bone density scan from years ago that initially indicated osteoporosis, but a subsequent scan showed normal results.  Her family history includes low cholesterol levels. She has not had her cholesterol checked since 2008, but she is not concerned due to her family history and dietary habits. She takes a B vitamin supplement, and her last B12 level was normal at 983.  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 She has never smoked or engaged in heavy drinking, as she is cautious about forming unhealthy habits. Her sister is a smoker and appears older than her age, which she attributes to smoking. Her son passed away, and she believes smoking was a  contributing factor.         Objective:    Today's Vitals   09/29/23 0859  BP: 124/66  Pulse: 63  Resp: 16  Temp: 98.7 F (37.1 C)  TempSrc: Temporal  SpO2: 98%  Weight: 138 lb 9.6 oz (62.9 kg)  Height: 5' 5.5" (1.664 m)  PainSc: 0-No pain   Body mass index is 22.71 kg/m.     05/28/2021   10:29 AM 04/14/2020    9:06 AM 03/23/2015    6:32 AM 03/16/2015    1:57 PM  Advanced Directives  Does Patient Have a Medical Advance Directive? Yes No Yes Yes  Type of Advance Directive    Living will    Current Medications (verified) Outpatient Encounter Medications as of 09/29/2023  Medication Sig   B Complex Vitamins (B COMPLEX 1 PO) Take 1 tablet by mouth daily.   Calcium Carbonate-Vit D-Min (CALCIUM 1200) 1200-1000 MG-UNIT CHEW Chew 1,200 mg by mouth daily.   CREATINE PO Take 1 each by mouth daily.   Misc Natural Products (TOTAL MEMORY & FOCUS FORMULA PO) Take 1 capsule by mouth daily.   Multiple Vitamin (MULTIVITAMIN) tablet Take 1 tablet by mouth daily.   timolol (TIMOPTIC) 0.25 % ophthalmic solution 1 drop into affected eye   tretinoin (RETIN-A) 0.025 % cream 1 application in the evening to face   [DISCONTINUED] latanoprost (XALATAN) 0.005 % ophthalmic solution    [DISCONTINUED] predniSONE  (DELTASONE ) 50 MG tablet Take 1 tablet (50 mg total) by mouth daily with breakfast.   No facility-administered encounter medications on file as of 09/29/2023.    Allergies (verified) Patient has  no known allergies.   History: Past Medical History:  Diagnosis Date   Atypical ductal hyperplasia of breast 03/17/2015   Atypical ductal hyperplasia, breast    Glaucoma    Past Surgical History:  Procedure Laterality Date   COLONOSCOPY  04/05/2016   RADIOACTIVE SEED GUIDED EXCISIONAL BREAST BIOPSY Left 03/23/2015   Procedure: RADIOACTIVE SEED GUIDED EXCISIONAL LEFT BREAST BIOPSY;  Surgeon: Enid Harry, MD;  Location: [facility];  Service: General;  Laterality: Left;    tooth implant  11/2022   WISDOM TOOTH EXTRACTION     Family History  Problem Relation Age of Onset   Stroke Mother    Osteoarthritis Mother    Deep vein thrombosis Mother    Neuropathy Mother    Arthritis/Rheumatoid Sister    Social History   Socioeconomic History   Marital status: Married    Spouse name: Not on file   Number of children: 1   Years of education: Not on file   Highest education level: Not on file  Occupational History   Occupation: [employer]  Tobacco Use   Smoking status: Never   Smokeless tobacco: Never  Vaping Use   Vaping status: Never Used  Substance and Sexual Activity   Alcohol use: Yes    Alcohol/week: 1.0 standard drink of alcohol    Types: 1 Glasses of wine per week    Comment: occas wine   Drug use: Never   Sexual activity: Not Currently  Other Topics Concern   Not on file  Social History Narrative   Not on file   Social Drivers of Health   Financial Resource Strain: Low Risk  (07/26/2022)   Overall Financial Resource Strain (CARDIA)    Difficulty of Paying Living Expenses: Not hard at all  Food Insecurity: No Food Insecurity (09/29/2023)   Hunger Vital Sign    Worried About Running Out of Food in the Last Year: Never true    Ran Out of Food in the Last Year: Never true  Transportation Needs: No Transportation Needs (09/29/2023)   PRAPARE - Administrator, Civil Service (Medical): No    Lack of Transportation (Non-Medical): No  Physical Activity: Sufficiently Active (05/28/2021)   Exercise Vital Sign    Days of Exercise per Week: 6 days    Minutes of Exercise per Session: 90 min  Stress: No Stress Concern Present (07/26/2022)   [employer] of Occupational Health - Occupational Stress Questionnaire    Feeling of Stress : Not at all  Social Connections: Moderately Isolated (07/26/2022)   Social Connection and Isolation Panel [NHANES]    Frequency of Communication with Friends and Family: Three times a week    Frequency of  Social Gatherings with Friends and Family: Three times a week    Attends Religious Services: Never    Active Member of Clubs or Organizations: No    Attends Banker Meetings: Never    Marital Status: Married    Tobacco Counseling Counseling given: Not Answered   Clinical Intake:  Pre-visit preparation completed: Yes  Pain : No/denies pain Pain Score: 0-No pain     BMI - recorded: 22.71 Nutritional Status: BMI of 19-24  Normal Nutritional Risks: None Diabetes: No  How often do you need to have someone help you when you read instructions, pamphlets, or other written materials from your doctor or pharmacy?: 1 - Never What is the last grade level you completed in school?: college [employer]  Interpreter Needed?: No  Activities of Daily Living     No data to display          Patient Care Team: Mercy Stall, MD as PCP - General (Family Medicine) Mammography, [facility] (Diagnostic Radiology) Dema Filler, MD as Consulting Physician (Ophthalmology)  Indicate any recent Medical Services you may have received from other than [facility] providers in the past year (date may be approximate).     Assessment:   This is a routine wellness examination for [place].  Hearing/Vision screen No results found.   Goals Addressed             This Visit's Progress    Activity and Exercise Increased       Evidence-based guidance:  Review current exercise levels.  Assess patient perspective on exercise or activity level, barriers to increasing activity, motivation and readiness for change.  Recommend or set healthy exercise goal based on individual tolerance.  Encourage small steps toward making change in amount of exercise or activity.  Urge reduction of sedentary activities or screen time.  Promote group activities within the community or with family or support person.  Consider referral to rehabiliation therapist for assessment and exercise/activity plan.   Notes:         Depression Screen    09/29/2023    9:35 AM 09/29/2023    9:19 AM 11/18/2022   11:03 AM 07/26/2022    3:09 PM 05/28/2021   10:24 AM 04/14/2020    9:06 AM 02/14/2020   10:18 AM  PHQ 2/9 Scores  PHQ - 2 Score 0 0 0 0 0 0 0  PHQ- 9 Score 2  5        Fall Risk    09/29/2023    9:17 AM 11/18/2022   11:03 AM 07/26/2022    3:09 PM 05/28/2021   10:29 AM 08/31/2020    3:20 PM  Fall Risk   Falls in the past year? 0 0 0 0 0  Number falls in past yr: 0 0 0 0 0  Injury with Fall? 0 0 0 0 0  Risk for fall due to : No Fall Risks No Fall Risks No Fall Risks No Fall Risks No Fall Risks  Follow up Falls evaluation completed Falls evaluation completed Falls evaluation completed Falls evaluation completed Falls evaluation completed    MEDICARE RISK AT HOME: Medicare Risk at Home Any stairs in or around the home?: Yes If so, are there any without handrails?: Yes Home free of loose throw rugs in walkways, pet beds, electrical cords, etc?: Yes Adequate lighting in your home to reduce risk of falls?: Yes Life alert?: No Use of a cane, walker or w/c?: No Grab bars in the bathroom?: No Shower chair or bench in shower?: Yes Elevated toilet seat or a handicapped toilet?: Yes  TIMED UP AND GO:  Was the test performed?  Yes  Length of time to ambulate 10 feet: 8 sec Gait steady and fast without use of assistive device    Cognitive Function:        09/29/2023    9:19 AM 05/28/2021   10:36 AM 04/14/2020    9:07 AM  6CIT Screen  What Year? 0 points 0 points 0 points  What month? 0 points 0 points 0 points  What time? 0 points 0 points 0 points  Count back from 20 0 points 0 points 0 points  Months in reverse 0 points 0 points 0 points  Repeat phrase 0 points 0 points 0 points  Total Score 0 points 0 points 0 points    Immunizations Immunization History  Administered Date(s) Administered   Fluad Quad(high Dose 65+) 01/23/2020   PFIZER(Purple Top)SARS-COV-2 Vaccination 06/09/2019, 06/29/2019,  02/20/2020   Pfizer Covid-19 Vaccine Bivalent Booster 24yrs & up 04/24/2021   Pfizer(Comirnaty)Fall Seasonal Vaccine 12 years and older 02/14/2022   Pneumococcal Conjugate-13 04/05/2016   Pneumococcal Polysaccharide-23 11/14/2018    TDAP status: Due, Education has been provided regarding the importance of this vaccine. Advised may receive this vaccine at local pharmacy or Health Dept. Aware to provide a copy of the vaccination record if obtained from local pharmacy or Health Dept. Verbalized acceptance and understanding.  Flu Vaccine status: Up to date  Pneumococcal vaccine status: Up to date  Covid-19 vaccine status: Information provided on how to obtain vaccines.   Qualifies for Shingles Vaccine? Yes   Zostavax completed No   Shingrix Completed?: No.    Education has been provided regarding the importance of this vaccine. Patient has been advised to call insurance company to determine out of pocket expense if they have not yet received this vaccine. Advised may also receive vaccine at local pharmacy or Health Dept. Verbalized acceptance and understanding.  Screening Tests Health Maintenance  Topic Date Due   Hepatitis C Screening  Never done   DTaP/Tdap/Td (1 - Tdap) Never done   Zoster Vaccines- Shingrix (1 of 2) Never done   COVID-19 Vaccine (6 - 2024-25 season) 01/22/2023   DEXA SCAN  09/28/2024 (Originally 08/23/2022)   INFLUENZA VACCINE  12/22/2023   Medicare Annual Wellness (AWV)  09/28/2024   Colonoscopy  04/05/2026   Pneumonia Vaccine 75+ Years old  Completed   HPV VACCINES  Aged Out   Meningococcal B Vaccine  Aged Out    Health Maintenance  Health Maintenance Due  Topic Date Due   Hepatitis C Screening  Never done   DTaP/Tdap/Td (1 - Tdap) Never done   Zoster Vaccines- Shingrix (1 of 2) Never done   COVID-19 Vaccine (6 - 2024-25 season) 01/22/2023    Colorectal cancer screening: Type of screening: Colonoscopy. Completed 04/05/2016. Repeat every 10  years  Mammogram status: Completed 07/29/23. Repeat every year  Bone Density status: Completed 08/22/20. Results reflect: Bone density results: OSTEOPOROSIS. Repeat every 2 years.  Lung Cancer Screening: (Low Dose CT Chest recommended if Age 87-80 years, 20 pack-year currently smoking OR have quit w/in 15years.) does not qualify.   Lung Cancer Screening Referral: N/A  Additional Screening:  Hepatitis C Screening: does qualify, needs to complete  Vision Screening: Recommended annual ophthalmology exams for early detection of glaucoma and other disorders of the eye. Is the patient up to date with their annual eye exam?  Yes  Who is the provider or what is the name of the office in which the patient attends annual eye exams? Dr McCuen at [facility] If pt is not established with a provider, would they like to be referred to a provider to establish care? N/A.   Dental Screening: Recommended annual dental exams for proper oral hygiene  Diabetic Foot Exam: N/A  Community Resource Referral / Chronic Care Management: CRR required this visit?  No   CCM required this visit?  No     Plan:    Encounter for Medicare annual wellness exam Assessment & Plan: Things to do to keep yourself healthy  - Exercise at least 30-45 minutes a day, 3-4 days a week.  - Eat a low-fat diet with lots of fruits and vegetables,  up to 7-9 servings per day.  - Seatbelts can save your life. Wear them always.  - Smoke detectors on every level of your home, check batteries every year.  - Eye Doctor - have an eye exam every 1-2 years  - Safe sex - if you may be exposed to STDs, use a condom.  - Alcohol -  If you drink, do it moderately, less than 2 drinks per day.  - Health Care Power of Attorney. Choose someone to speak for you if you are not able.  - Depression is common in our stressful world.If you're feeling down or losing interest in things you normally enjoy, please come in for a visit.   - Violence - If anyone is threatening or hurting you, please call immediately.     I have personally reviewed and noted the following in the patient's chart:   Medical and social history Use of alcohol, tobacco or illicit drugs  Current medications and supplements including opioid prescriptions. Patient is not currently taking opioid prescriptions. Functional ability and status Nutritional status Physical activity Advanced directives List of other physicians Hospitalizations, surgeries, and ER visits in previous 12 months Vitals Screenings to include cognitive, depression, and falls Referrals and appointments  In addition, I have reviewed and discussed with patient certain preventive protocols, quality metrics, and best practice recommendations. A written personalized care plan for preventive services as well as general preventive health recommendations were provided to patient.    Delford Felling, FNP [facility] [phone]    09/29/2023   After Visit Summary: (Declined) Due to this being a telephonic visit, with patients personalized plan was offered to patient but patient Declined AVS at this time

## 2023-09-28 NOTE — Telephone Encounter (Signed)
 Appointment has been scheduled.

## 2023-09-28 NOTE — Telephone Encounter (Signed)
 Contacted Monique Atkins to schedule their annual wellness visit. Appointment made for 09/29/2023.

## 2023-09-29 ENCOUNTER — Encounter: Payer: Self-pay | Admitting: Family Medicine

## 2023-09-29 ENCOUNTER — Ambulatory Visit (INDEPENDENT_AMBULATORY_CARE_PROVIDER_SITE_OTHER): Admitting: Family Medicine

## 2023-09-29 VITALS — BP 124/66 | HR 63 | Temp 98.7°F | Resp 16 | Ht 65.5 in | Wt 138.6 lb

## 2023-09-29 DIAGNOSIS — Z0000 Encounter for general adult medical examination without abnormal findings: Secondary | ICD-10-CM | POA: Insufficient documentation

## 2023-09-29 NOTE — Assessment & Plan Note (Signed)

## 2023-11-14 DIAGNOSIS — H401113 Primary open-angle glaucoma, right eye, severe stage: Secondary | ICD-10-CM | POA: Diagnosis not present

## 2023-11-14 DIAGNOSIS — H401121 Primary open-angle glaucoma, left eye, mild stage: Secondary | ICD-10-CM | POA: Diagnosis not present

## 2024-02-14 DIAGNOSIS — H401112 Primary open-angle glaucoma, right eye, moderate stage: Secondary | ICD-10-CM | POA: Diagnosis not present

## 2024-02-14 DIAGNOSIS — H401121 Primary open-angle glaucoma, left eye, mild stage: Secondary | ICD-10-CM | POA: Diagnosis not present

## 2024-02-21 ENCOUNTER — Ambulatory Visit: Admitting: Family Medicine

## 2024-02-21 ENCOUNTER — Ambulatory Visit: Payer: Self-pay | Admitting: Family Medicine

## 2024-02-21 VITALS — BP 148/98 | HR 86 | Temp 98.3°F | Ht 65.5 in | Wt 141.0 lb

## 2024-02-21 DIAGNOSIS — Z23 Encounter for immunization: Secondary | ICD-10-CM

## 2024-02-21 DIAGNOSIS — R6884 Jaw pain: Secondary | ICD-10-CM

## 2024-02-21 DIAGNOSIS — R030 Elevated blood-pressure reading, without diagnosis of hypertension: Secondary | ICD-10-CM

## 2024-02-21 DIAGNOSIS — R0789 Other chest pain: Secondary | ICD-10-CM | POA: Diagnosis not present

## 2024-02-21 DIAGNOSIS — M791 Myalgia, unspecified site: Secondary | ICD-10-CM | POA: Diagnosis not present

## 2024-02-21 LAB — LAB REPORT - SCANNED: EGFR: 66

## 2024-02-21 NOTE — Progress Notes (Signed)
 Acute Office Visit  Subjective:    Patient ID: Monique Atkins, female    DOB: 07-15-1945, 78 y.o.   MRN: [id number]  Chief Complaint  Patient presents with   Edema    Right ankle. Exercises regularly. Did a lot of walking while in [place] at a conference. Also states she had jaw pain, shoulder and chest pain last 3-5 minutes.    HPI:  Discussed the use of AI scribe software for clinical note transcription with the patient, who gave verbal consent to proceed.  History of Present Illness Monique Atkins is a 78 year old female who presents with episodes of sweating, jaw pain, and chest discomfort.  Episodic diaphoresis, jaw pain, and chest discomfort - Episodes of sweating, jaw pain, and chest discomfort occurred twice within a few days - Each episode lasted approximately three to five minutes - Symptoms included feeling 'real sweaty and uncomfortable' without environmental heat as a trigger - Significant pain in the entire jaw, which has occurred previously - Discomfort present in the chest and both shoulders - No associated shortness of breath, nausea, or stress during episodes - No chest, jaw, or shoulder pain during exercise - No symptoms of acid reflux or heartburn during episodes - Normal coronary ct in 2021.   Chronic lower back pain - Chronic lower back pain attributed to degenerative disc disease and scoliosis - Pain managed with exercises using a weighted ball, which provides significant relief  Right ankle edema - Significant swelling in the right ankle after extensive walking at a conference - No history of injury or twisting of the ankle - Swelling resolved after approximately three days - No further pain.     Past Medical History:  Diagnosis Date   Atypical ductal hyperplasia of breast 03/17/2015   Atypical ductal hyperplasia, breast    Glaucoma     Past Surgical History:  Procedure Laterality Date   COLONOSCOPY  04/05/2016    RADIOACTIVE SEED GUIDED EXCISIONAL BREAST BIOPSY Left 03/23/2015   Procedure: RADIOACTIVE SEED GUIDED EXCISIONAL LEFT BREAST BIOPSY;  Surgeon: Donnice Bury, MD;  [facility]:  [facility];  Service: General;  Laterality: Left;   tooth implant  11/2022   WISDOM TOOTH EXTRACTION      Family History  Problem Relation Age of Onset   Stroke Mother    Osteoarthritis Mother    Deep vein thrombosis Mother    Neuropathy Mother    Arthritis/Rheumatoid Sister     Social History   Socioeconomic History   Marital status: Married    Spouse name: Not on file   Number of children: 1   Years of education: Not on file   Highest education level: Master's degree (e.g., MA, MS, MEng, MEd, MSW, MBA)  Occupational History   Occupation: [employer]  Tobacco Use   Smoking status: Never   Smokeless tobacco: Never  Vaping Use   Vaping status: Never Used  Substance and Sexual Activity   Alcohol use: Yes    Alcohol/week: 1.0 standard drink of alcohol    Types: 1 Glasses of wine per week    Comment: occas wine   Drug use: Never   Sexual activity: Not Currently  Other Topics Concern   Not on file  Social History Narrative   Not on file   Social Drivers of Health   Financial Resource Strain: Low Risk  (02/21/2024)   Overall Financial Resource Strain (CARDIA)    Difficulty of Paying Living Expenses: Not hard at all  Food Insecurity: No Food Insecurity (02/21/2024)   Hunger Vital Sign    Worried About Running Out of Food in the Last Year: Never true    Ran Out of Food in the Last Year: Never true  Transportation Needs: No Transportation Needs (02/21/2024)   PRAPARE - Administrator, Civil Service (Medical): No    Lack of Transportation (Non-Medical): No  Physical Activity: Sufficiently Active (02/21/2024)   Exercise Vital Sign    Days of Exercise per Week: 5 days    Minutes of Exercise per Session: 50 min  Stress: Stress Concern Present (02/21/2024)   [employer] - Occupational Stress Questionnaire    Feeling of Stress: To some extent  Social Connections: Moderately Integrated (02/21/2024)   Social Connection and Isolation Panel    Frequency of Communication with Friends and Family: More than three times a week    Frequency of Social Gatherings with Friends and Family: Once a week    Attends Religious Services: 1 to 4 times per year    Active Member of [employer] or Organizations: No    Attends Engineer, structural: Not on file    Marital Status: Married  Catering manager Violence: Not At Risk (09/29/2023)   Humiliation, Afraid, Rape, and Kick questionnaire    Fear of Current or Ex-Partner: No    Emotionally Abused: No    Physically Abused: No    Sexually Abused: No    Outpatient Medications Prior to Visit  Medication Sig Dispense Refill   B Complex Vitamins (B COMPLEX 1 PO) Take 1 tablet by mouth daily.     Calcium Carbonate-Vit D-Min (CALCIUM 1200) 1200-1000 MG-UNIT CHEW Chew 1,200 mg by mouth daily.     CREATINE PO Take 1 each by mouth daily.     Misc Natural Products (TOTAL MEMORY & FOCUS FORMULA PO) Take 1 capsule by mouth daily.     Multiple Vitamin (MULTIVITAMIN) tablet Take 1 tablet by mouth daily.     timolol (TIMOPTIC) 0.25 % ophthalmic solution 1 drop into affected eye     tretinoin (RETIN-A) 0.025 % cream 1 application in the evening to face     No facility-administered medications prior to visit.    No Known Allergies  Review of Systems  Constitutional:  Negative for chills, fatigue and fever.  HENT:  Negative for congestion, ear pain and sore throat.   Respiratory:  Negative for cough and shortness of breath.   Cardiovascular:  Negative for chest pain.  Gastrointestinal:  Negative for abdominal pain, constipation, diarrhea, nausea and vomiting.  Genitourinary:  Negative for dysuria and urgency.  Musculoskeletal:  Positive for back pain and myalgias. Negative for arthralgias.  Skin:   Negative for rash.  Neurological:  Negative for dizziness and headaches.  Psychiatric/Behavioral:  Negative for dysphoric mood. The patient is not nervous/anxious.        Objective:        02/21/2024    2:23 PM 09/29/2023    8:59 AM 11/18/2022   11:02 AM  Vitals with BMI  Height 5' 5.5 5' 5.5 5' 5.5  Weight 141 lbs 138 lbs 10 oz 143 lbs  BMI 23.1 22.71 23.43  Systolic 148 124 867  Diastolic 98 66 80  Pulse 86 63 63    No data found.   Physical Exam Vitals reviewed.  Constitutional:      Appearance: Normal appearance. She is normal weight.  Neck:     Vascular: No carotid bruit.  Cardiovascular:     Rate and Rhythm: Normal rate and regular rhythm.     Heart sounds: Normal heart sounds.     Comments: No costochondral pain.  Pulmonary:     Effort: Pulmonary effort is normal. No respiratory distress.     Breath sounds: Normal breath sounds.  Abdominal:     General: Abdomen is flat. Bowel sounds are normal.     Palpations: Abdomen is soft.     Tenderness: There is no abdominal tenderness.  Musculoskeletal:     Comments: Right ankle exam normal. No edema  No edema in left ankle.   Neurological:     Mental Status: She is alert and oriented to person, place, and time.  Psychiatric:        Mood and Affect: Mood normal.        Behavior: Behavior normal.     Health Maintenance Due  Topic Date Due   Hepatitis C Screening  Never done   DTaP/Tdap/Td (1 - Tdap) Never done   Zoster Vaccines- Shingrix (1 of 2) Never done    There are no preventive care reminders to display for this patient.   Lab Results  Component Value Date   TSH 2.37 08/10/2006   Lab Results  Component Value Date   WBC 6.3 11/27/2020   HGB 13.9 11/27/2020   HCT 40.1 11/27/2020   MCV 86 11/27/2020   PLT 261 11/27/2020   Lab Results  Component Value Date   NA 140 11/27/2020   K 4.8 11/27/2020   CO2 25 11/27/2020   GLUCOSE 94 11/27/2020   BUN 11 11/27/2020   CREATININE 0.93 11/27/2020    BILITOT 0.4 11/27/2020   ALKPHOS 64 11/27/2020   AST 14 11/27/2020   ALT 14 11/27/2020   PROT 6.6 11/27/2020   ALBUMIN 4.5 11/27/2020   CALCIUM 10.0 11/27/2020   EGFR 65 11/27/2020   Lab Results  Component Value Date   CHOL 186 08/10/2006   Lab Results  Component Value Date   HDL 114.0 08/10/2006   Lab Results  Component Value Date   LDLCALC 54 08/10/2006   Lab Results  Component Value Date   TRIG 91 08/10/2006   Lab Results  Component Value Date   CHOLHDL 1.6 CALC 08/10/2006   No results found for: HGBA1C      Results for orders placed or performed in visit on 08/02/23  [facility] MAMMOGRAPHY   Collection Time: 07/29/23  8:02 AM  Result Value Ref Range   HM Mammogram 0-4 Bi-Rad 0-4 Bi-Rad, Self Reported Normal     Assessment & Plan:   Assessment & Plan Other chest pain Intermittent chest pain with sweating, jaw pain, and bilateral shoulder discomfort. Previous cardiac workup normal in 2021 with Dr. Sheena.  Differential includes cardiac causes, pleurisy, muscle spasm, pulmonary embolism and acid reflux.  Cardiac fit the best but she has no symptoms with exertion so this is counter to this.  No evidence of other issues in ddx.  Hopefully it is musculoskeletal.   EKG NSR. NO ST CHANGES. - Order cardiac protein test at hospital outpatient department. - If cardiac protein test normal, monitor symptoms and consider cardiologist referral if symptoms recur. Orders:   EKG 12-Lead  Myalgia - continue to drink plenty of water.  - checking labs at [facility].     Jaw pain Intermittent chest pain with sweating, jaw pain, and bilateral shoulder discomfort. Previous cardiac workup normal. Differential includes cardiac causes, pleurisy, and acid reflux. Blood clot  unlikely. - Order cardiac protein test at hospital outpatient department. - If cardiac protein test normal, monitor symptoms and consider cardiologist referral if symptoms recur.    Encounter for  immunization  Orders:   Flu vaccine HIGH DOSE PF(Fluzone  Trivalent)  Encounter for immunization  Orders:   Pfizer Comirnaty Covid-19 Vaccine 77yrs & older  Elevated BP reading w/ no diagnosis of HTN Recheck bp in 1-2 weeks.     Body mass index is 23.11 kg/m.SABRA  No orders of the defined types were placed in this encounter.   Orders Placed This Encounter  Procedures   Flu vaccine HIGH DOSE PF(Fluzone  Trivalent)   Pfizer Comirnaty Covid-19 Vaccine 62yrs & older   EKG 12-Lead    Total time spent on today's visit was 30 minutes, including both face-to-face time and nonface-to-face time personally spent on review of chart (labs and imaging), discussing labs and goals, discussing further work-up, treatment options, referrals to specialist if needed, reviewing outside records of pertinent, answering patient's questions, and coordinating care.  Follow-up: Return if symptoms worsen or fail to improve, but also will need to have a bp check in 1-2 weeks.  An After Visit Summary was printed and given to the patient.  Abigail Free, MD [facility] [phone]

## 2024-02-21 NOTE — Assessment & Plan Note (Addendum)
 Intermittent chest pain with sweating, jaw pain, and bilateral shoulder discomfort. Previous cardiac workup normal in 2021 with Dr. Sheena.  Differential includes cardiac causes, pleurisy, muscle spasm, pulmonary embolism and acid reflux.  Cardiac fit the best but she has no symptoms with exertion so this is counter to this.  No evidence of other issues in ddx.  Hopefully it is musculoskeletal.   EKG NSR. NO ST CHANGES. - Order cardiac protein test at hospital outpatient department. - If cardiac protein test normal, monitor symptoms and consider cardiologist referral if symptoms recur. Orders:   EKG 12-Lead

## 2024-02-21 NOTE — Assessment & Plan Note (Signed)
 Recheck bp in 1-2 weeks.

## 2024-02-21 NOTE — Assessment & Plan Note (Addendum)
 Intermittent chest pain with sweating, jaw pain, and bilateral shoulder discomfort. Previous cardiac workup normal. Differential includes cardiac causes, pleurisy, and acid reflux. Blood clot unlikely. - Order cardiac protein test at hospital outpatient department. - If cardiac protein test normal, monitor symptoms and consider cardiologist referral if symptoms recur.

## 2024-02-21 NOTE — Patient Instructions (Addendum)
  VISIT SUMMARY: Today, you were seen for episodes of sweating, jaw pain, and chest discomfort. We also discussed your chronic lower back pain and recent right ankle swelling.  YOUR PLAN: CHEST PAIN: You experienced intermittent chest pain with sweating, jaw pain, and discomfort in both shoulders. Previous cardiac tests were normal. -We will order a cardiac protein test at the hospital outpatient department. -If the cardiac protein test is normal, we will monitor your symptoms and consider referring you to a cardiologist if the symptoms come back.  MUSCLE CRAMPS: - continue to drink plenty of water.  - checking labs at [facility].   RIGHT ANKLE SWELLING, RESOLVED: You had significant swelling in your right ankle after extensive walking, which resolved after three days. -No further action is needed since the swelling has resolved.  YOUR BP WAS UP WHEN YOU WERE HERE. We will need to have you return for bp check in 1-2 weeks.                       Contains text generated by [employer]bridge.                                 Contains text generated by Abridge.

## 2024-02-21 NOTE — Assessment & Plan Note (Addendum)
-   continue to drink plenty of water.  - checking labs at [facility].

## 2024-03-25 DIAGNOSIS — L821 Other seborrheic keratosis: Secondary | ICD-10-CM | POA: Diagnosis not present

## 2024-03-25 DIAGNOSIS — D1801 Hemangioma of skin and subcutaneous tissue: Secondary | ICD-10-CM | POA: Diagnosis not present

## 2024-03-25 DIAGNOSIS — L814 Other melanin hyperpigmentation: Secondary | ICD-10-CM | POA: Diagnosis not present

## 2024-03-25 DIAGNOSIS — D229 Melanocytic nevi, unspecified: Secondary | ICD-10-CM | POA: Diagnosis not present

## 2024-03-25 DIAGNOSIS — L570 Actinic keratosis: Secondary | ICD-10-CM | POA: Diagnosis not present

## 2024-03-25 DIAGNOSIS — L818 Other specified disorders of pigmentation: Secondary | ICD-10-CM | POA: Diagnosis not present

## 2024-03-25 DIAGNOSIS — L709 Acne, unspecified: Secondary | ICD-10-CM | POA: Diagnosis not present

## 2024-03-25 DIAGNOSIS — L578 Other skin changes due to chronic exposure to nonionizing radiation: Secondary | ICD-10-CM | POA: Diagnosis not present
